# Patient Record
Sex: Male | Born: 1970 | Race: Black or African American | Hispanic: No | Marital: Single | Smoking: Current every day smoker
Health system: Southern US, Community
[De-identification: ages and names within clinical notes are randomized; demographics above are authoritative.]

## PROBLEM LIST (undated history)

## (undated) DIAGNOSIS — I10 Essential (primary) hypertension: Secondary | ICD-10-CM

## (undated) DIAGNOSIS — E119 Type 2 diabetes mellitus without complications: Secondary | ICD-10-CM

---

## 2001-12-13 ENCOUNTER — Encounter: Payer: Self-pay | Admitting: Internal Medicine

## 2001-12-13 ENCOUNTER — Emergency Department (HOSPITAL_COMMUNITY): Admission: EM | Admit: 2001-12-13 | Discharge: 2001-12-13 | Payer: Self-pay | Admitting: Internal Medicine

## 2001-12-14 ENCOUNTER — Emergency Department (HOSPITAL_COMMUNITY): Admission: EM | Admit: 2001-12-14 | Discharge: 2001-12-14 | Payer: Self-pay | Admitting: Emergency Medicine

## 2001-12-15 ENCOUNTER — Emergency Department (HOSPITAL_COMMUNITY): Admission: EM | Admit: 2001-12-15 | Discharge: 2001-12-15 | Payer: Self-pay | Admitting: Emergency Medicine

## 2002-01-11 ENCOUNTER — Emergency Department (HOSPITAL_COMMUNITY): Admission: EM | Admit: 2002-01-11 | Discharge: 2002-01-12 | Payer: Self-pay | Admitting: Emergency Medicine

## 2002-01-12 ENCOUNTER — Encounter: Payer: Self-pay | Admitting: Emergency Medicine

## 2005-07-15 ENCOUNTER — Emergency Department (HOSPITAL_COMMUNITY): Admission: EM | Admit: 2005-07-15 | Discharge: 2005-07-15 | Payer: Self-pay | Admitting: Emergency Medicine

## 2007-10-18 ENCOUNTER — Emergency Department (HOSPITAL_COMMUNITY): Admission: EM | Admit: 2007-10-18 | Discharge: 2007-10-18 | Payer: Self-pay | Admitting: Emergency Medicine

## 2008-04-17 ENCOUNTER — Emergency Department (HOSPITAL_COMMUNITY): Admission: EM | Admit: 2008-04-17 | Discharge: 2008-04-17 | Payer: Self-pay | Admitting: Emergency Medicine

## 2008-08-10 ENCOUNTER — Emergency Department (HOSPITAL_COMMUNITY): Admission: EM | Admit: 2008-08-10 | Discharge: 2008-08-10 | Payer: Self-pay | Admitting: Emergency Medicine

## 2009-09-28 ENCOUNTER — Emergency Department (HOSPITAL_COMMUNITY): Admission: EM | Admit: 2009-09-28 | Discharge: 2009-09-29 | Payer: Self-pay | Admitting: Emergency Medicine

## 2010-02-27 ENCOUNTER — Emergency Department (HOSPITAL_COMMUNITY): Admission: EM | Admit: 2010-02-27 | Discharge: 2010-02-27 | Payer: Self-pay | Admitting: Emergency Medicine

## 2010-03-02 ENCOUNTER — Emergency Department (HOSPITAL_COMMUNITY): Admission: EM | Admit: 2010-03-02 | Discharge: 2010-03-02 | Payer: Self-pay | Admitting: Emergency Medicine

## 2010-12-26 LAB — URINE CULTURE
Colony Count: NO GROWTH
Culture: NO GROWTH

## 2010-12-26 LAB — URINALYSIS, ROUTINE W REFLEX MICROSCOPIC
Glucose, UA: NEGATIVE mg/dL
Ketones, ur: 15 mg/dL — AB
Leukocytes, UA: NEGATIVE
Nitrite: NEGATIVE
Specific Gravity, Urine: >1.03 — ABNORMAL HIGH (ref 1.005–1.030)
Urobilinogen, UA: 1 mg/dL (ref 0.0–1.0)
pH: 5.5 (ref 5.0–8.0)

## 2010-12-26 LAB — URINE MICROSCOPIC-ADD ON

## 2011-07-11 LAB — DIFFERENTIAL
Basophils Absolute: 0
Basophils Relative: 0
Eosinophils Absolute: 0.2
Eosinophils Relative: 2
Lymphocytes Relative: 29
Lymphs Abs: 2.3
Monocytes Absolute: 0.6
Monocytes Relative: 8
Neutro Abs: 4.8
Neutrophils Relative %: 60

## 2011-07-11 LAB — URINALYSIS, ROUTINE W REFLEX MICROSCOPIC
Bilirubin Urine: NEGATIVE
Glucose, UA: NEGATIVE
Hgb urine dipstick: NEGATIVE
Ketones, ur: NEGATIVE
Nitrite: NEGATIVE
Protein, ur: NEGATIVE
Specific Gravity, Urine: >1.03 — ABNORMAL HIGH
Urobilinogen, UA: 0.2
pH: 5

## 2011-07-11 LAB — BASIC METABOLIC PANEL
BUN: 10
CO2: 25
Calcium: 9.3
Chloride: 102
Creatinine, Ser: 1.07
GFR calc Af Amer: >60
GFR calc non Af Amer: >60
Glucose, Bld: 113 — ABNORMAL HIGH
Potassium: 4
Sodium: 136

## 2011-07-11 LAB — CBC
HCT: 46
Hemoglobin: 15.3
MCHC: 33.2
MCV: 84.7
Platelets: 163
RBC: 5.44
RDW: 13.4
WBC: 8

## 2013-09-17 ENCOUNTER — Emergency Department (HOSPITAL_COMMUNITY)
Admission: EM | Admit: 2013-09-17 | Discharge: 2013-09-17 | Disposition: A | Discharge status: Home / Self Care | Payer: PRIVATE HEALTH INSURANCE | Attending: Emergency Medicine | Admitting: Emergency Medicine

## 2013-09-17 ENCOUNTER — Emergency Department (HOSPITAL_COMMUNITY): Payer: PRIVATE HEALTH INSURANCE

## 2013-09-17 ENCOUNTER — Encounter (HOSPITAL_COMMUNITY): Payer: Self-pay | Admitting: Emergency Medicine

## 2013-09-17 DIAGNOSIS — Y929 Unspecified place or not applicable: Secondary | ICD-10-CM | POA: Insufficient documentation

## 2013-09-17 DIAGNOSIS — S8990XA Unspecified injury of unspecified lower leg, initial encounter: Secondary | ICD-10-CM | POA: Insufficient documentation

## 2013-09-17 DIAGNOSIS — M79672 Pain in left foot: Secondary | ICD-10-CM

## 2013-09-17 DIAGNOSIS — I10 Essential (primary) hypertension: Secondary | ICD-10-CM | POA: Insufficient documentation

## 2013-09-17 DIAGNOSIS — Y939 Activity, unspecified: Secondary | ICD-10-CM | POA: Insufficient documentation

## 2013-09-17 DIAGNOSIS — F17200 Nicotine dependence, unspecified, uncomplicated: Secondary | ICD-10-CM | POA: Insufficient documentation

## 2013-09-17 DIAGNOSIS — X500XXA Overexertion from strenuous movement or load, initial encounter: Secondary | ICD-10-CM | POA: Insufficient documentation

## 2013-09-17 HISTORY — DX: Essential (primary) hypertension: I10

## 2013-09-17 MED ORDER — IBUPROFEN 400 MG PO TABS
600.0000 mg | ORAL_TABLET | Freq: Once | ORAL | Status: DC
  Filled 2013-09-17: qty 2

## 2013-09-17 MED ORDER — IBUPROFEN 600 MG PO TABS: 600.0000 mg | ORAL_TABLET | Freq: Three times a day (TID) | ORAL | Status: DC | PRN

## 2013-09-17 MED ORDER — OXYCODONE-ACETAMINOPHEN 5-325 MG PO TABS
1.0000 | ORAL_TABLET | Freq: Once | ORAL | Status: AC
  Administered 2013-09-17: 1 via ORAL
  Filled 2013-09-17: qty 1

## 2013-09-17 MED ORDER — HYDROCODONE-ACETAMINOPHEN 5-325 MG PO TABS: 1.0000 | ORAL_TABLET | ORAL | Status: DC | PRN

## 2013-09-17 MED ORDER — IBUPROFEN 800 MG PO TABS
800.0000 mg | ORAL_TABLET | Freq: Once | ORAL | Status: AC
  Administered 2013-09-17: 800 mg via ORAL

## 2013-09-17 NOTE — ED Notes (Signed)
Pt back from x-ray.

## 2013-09-17 NOTE — ED Provider Notes (Signed)
CSN: [id number]     Arrival date & time 09/17/13  0745 History  This chart was scribed for Lyanne Co, MD by Quintella Reichert, ED scribe.  This patient was seen in room APA04/[facility]04 and the patient's care was started at 8:16 AM.   Chief Complaint  Patient presents with  . Foot Pain    The history is provided by the patient. No language interpreter was used.    HPI Comments: Alec Howell is a 42 y.o. male who presents to the Emergency Department complaining of left ankle pain. Pt states that one month ago he rolled his ankle outwards and immediately developed moderate-to-severe pain to that ankle.  His pain initially improved but then began worsening again yesterday.  Presently he complains of severe throbbing pain across the top of his ankle on both sides.  Pain is worsened by bearing weight.  He is ambulatory with pain.  He has attempted to treat pain with Aleve, without relief.   Past Medical History  Diagnosis Date  . Hypertension     History reviewed. No pertinent past surgical history.  No family history on file.   History  Substance Use Topics  . Smoking status: Current Every Day Smoker  . Smokeless tobacco: Not on file  . Alcohol Use: No     Review of Systems A complete 10 system review of systems was obtained and all systems are negative except as noted in the HPI and PMH.    Allergies  Review of patient's allergies indicates no known allergies.  Home Medications   Current Outpatient Rx  Name  Route  Sig  Dispense  Refill  . naproxen sodium (ALEVE) 220 MG tablet   Oral   Take 220 mg by mouth 2 (two) times daily as needed (pain).         Marland Kitchen HYDROcodone-acetaminophen (NORCO/VICODIN) 5-325 MG per tablet   Oral   Take 1 tablet by mouth every 4 (four) hours as needed for moderate pain.   15 tablet   0   . ibuprofen (ADVIL,MOTRIN) 600 MG tablet   Oral   Take 1 tablet (600 mg total) by mouth every 8 (eight) hours as needed.   15 tablet   0     BP  190/112  Pulse 88  Temp(Src) 98.3 F (36.8 C) (Oral)  Resp 20  SpO2 100%  Physical Exam  Nursing note and vitals reviewed. Constitutional: He is oriented to person, place, and time. He appears well-developed and well-nourished.  HENT:  Head: Normocephalic.  Eyes: EOM are normal.  Neck: Normal range of motion.  Pulmonary/Chest: Effort normal.  Abdominal: He exhibits no distension.  Musculoskeletal:  Normal DP and PT pulses. Tender over anterior mid-foot.  No tenderness over medial malleolus.  Mild tenderness at the left lateral malleolus.  Mild swelling of the dorsum of the left foot.  No erythema.  Neurological: He is alert and oriented to person, place, and time.  Psychiatric: He has a normal mood and affect.    ED Course  Procedures (including critical care time)  DIAGNOSTIC STUDIES: Oxygen Saturation is 100% on room air, normal by my interpretation.    COORDINATION OF CARE: 9:11 AM-Discussed treatment plan which includes pain medication and x-ray with pt at bedside and pt agreed to plan.    Labs Review Labs Reviewed - No data to display   Imaging Review Dg Ankle Complete Left  09/17/2013   CLINICAL DATA:  Ankle pain since a twisting injury 1 month  ago.  EXAM: LEFT ANKLE COMPLETE - 3+ VIEW  COMPARISON:  None.  FINDINGS: There is no fracture or dislocation. Tiny posterior spur on the distal tibia. No joint effusion.  IMPRESSION: Minimal arthritic change.   Electronically Signed   By: Geanie Cooley M.D.   On: 09/17/2013 08:47   Dg Foot Complete Left  09/17/2013   CLINICAL DATA:  Left foot and ankle pain since a twisting injury 1 month ago.  EXAM: LEFT FOOT - COMPLETE 3+ VIEW  COMPARISON:  None.  FINDINGS: There is slight bunion formation on the head of the 1st metatarsal. There is slight dorsal soft tissue swelling over the tarsal bones. Slight calcification at the Achilles insertion on the posterior calcaneus.  No fracture or dislocation or significant arthritis.   IMPRESSION: Dorsal soft tissue swelling. Bunion on the head of the 1st metatarsal.   Electronically Signed   By: Geanie Cooley M.D.   On: 09/17/2013 08:45  I personally reviewed the imaging tests through PACS system I reviewed available ER/hospitalization records through the EMR   EKG Interpretation   None       MDM   1. Left foot pain    Plain films normal. Normal pulses. No signs of infection. Ortho follow up. Ice and elevation.     I personally performed the services described in this documentation, which was scribed in my presence. The recorded information has been reviewed and is accurate.      Lyanne Co, MD 09/17/13 [phone]

## 2013-09-17 NOTE — ED Notes (Signed)
Pt reports twisted left ankle approx 1 month ago.  Reports ankle is not tender but top of foot is very tender to touch.  Reports is very painful with weight bearing.

## 2014-05-13 ENCOUNTER — Emergency Department (HOSPITAL_COMMUNITY): Payer: PRIVATE HEALTH INSURANCE

## 2014-05-13 ENCOUNTER — Encounter (HOSPITAL_COMMUNITY): Payer: Self-pay | Admitting: Emergency Medicine

## 2014-05-13 ENCOUNTER — Emergency Department (HOSPITAL_COMMUNITY)
Admission: EM | Admit: 2014-05-13 | Discharge: 2014-05-13 | Disposition: A | Discharge status: Home / Self Care | Payer: PRIVATE HEALTH INSURANCE | Attending: Emergency Medicine | Admitting: Emergency Medicine

## 2014-05-13 DIAGNOSIS — IMO0002 Reserved for concepts with insufficient information to code with codable children: Secondary | ICD-10-CM | POA: Insufficient documentation

## 2014-05-13 DIAGNOSIS — Z791 Long term (current) use of non-steroidal anti-inflammatories (NSAID): Secondary | ICD-10-CM | POA: Insufficient documentation

## 2014-05-13 DIAGNOSIS — F17200 Nicotine dependence, unspecified, uncomplicated: Secondary | ICD-10-CM | POA: Insufficient documentation

## 2014-05-13 DIAGNOSIS — M25569 Pain in unspecified knee: Secondary | ICD-10-CM | POA: Insufficient documentation

## 2014-05-13 DIAGNOSIS — Y939 Activity, unspecified: Secondary | ICD-10-CM | POA: Insufficient documentation

## 2014-05-13 DIAGNOSIS — X58XXXA Exposure to other specified factors, initial encounter: Secondary | ICD-10-CM | POA: Insufficient documentation

## 2014-05-13 DIAGNOSIS — Z87828 Personal history of other (healed) physical injury and trauma: Secondary | ICD-10-CM | POA: Insufficient documentation

## 2014-05-13 DIAGNOSIS — Y929 Unspecified place or not applicable: Secondary | ICD-10-CM | POA: Insufficient documentation

## 2014-05-13 DIAGNOSIS — I10 Essential (primary) hypertension: Secondary | ICD-10-CM | POA: Insufficient documentation

## 2014-05-13 DIAGNOSIS — S8391XA Sprain of unspecified site of right knee, initial encounter: Secondary | ICD-10-CM

## 2014-05-13 MED ORDER — NAPROXEN 500 MG PO TABS: 500.0000 mg | ORAL_TABLET | Freq: Two times a day (BID) | ORAL | Status: DC

## 2014-05-13 NOTE — ED Notes (Signed)
Pt c/o right knee pain and swelling since Monday. Pt denies injury and states this is an intermittent chronic problem. Pt reports tenderness and site is warm to touch.

## 2014-05-13 NOTE — ED Notes (Signed)
Right knee pain, onset Monday, swelling, hard to bend knee, denies injury. Pt comes in on crutches.

## 2014-05-13 NOTE — ED Provider Notes (Signed)
CSN: [id number]     Arrival date & time 05/13/14  4098 History  This chart was scribed for Alec Lennert, MD by Phillis Haggis, ED Scribe. This patient was seen in room APA06/APA06 and patient care was started at 7:31 AM.    Chief Complaint  Patient presents with  . Knee Pain   Patient is a 43 y.o. male presenting with knee pain. The history is provided by the patient. No language interpreter was used.  Knee Pain Location:  Knee Time since incident:  2 days Knee location:  R knee Pain details:    Radiates to:  Does not radiate   Severity:  Moderate   Duration:  2 days   Timing:  Constant Chronicity:  New Ineffective treatments:  Acetaminophen Associated symptoms: swelling   Associated symptoms: no back pain and no fatigue    HPI Comments: Alec Howell is a 43 y.o. male who presents to the Emergency Department complaining of right knee pain onset 2 days ago. He states that the pain has gotten worse over the past days. He states that he has been taking ibuprofen for the pain to some relief. He denies any recent injuries but states that he had an old injury to the area playing football.    Past Medical History  Diagnosis Date  . Hypertension    History reviewed. No pertinent past surgical history. History reviewed. No pertinent family history. History  Substance Use Topics  . Smoking status: Current Every Day Smoker -- 1.00 packs/day    Types: Cigarettes  . Smokeless tobacco: Not on file  . Alcohol Use: Yes    Review of Systems  Constitutional: Negative for appetite change and fatigue.  HENT: Negative for congestion, ear discharge and sinus pressure.   Eyes: Negative for discharge.  Respiratory: Negative for cough.   Cardiovascular: Negative for chest pain.  Gastrointestinal: Negative for abdominal pain and diarrhea.  Genitourinary: Negative for frequency and hematuria.  Musculoskeletal: Negative for back pain.  Skin: Negative for rash.  Neurological: Negative for  seizures and headaches.  Psychiatric/Behavioral: Negative for hallucinations.      Allergies  Review of patient's allergies indicates no known allergies.  Home Medications   Prior to Admission medications   Medication Sig Start Date End Date Taking? Authorizing Provider  HYDROcodone-acetaminophen (NORCO/VICODIN) 5-325 MG per tablet Take 1 tablet by mouth every 4 (four) hours as needed for moderate pain. 09/17/13   Lyanne Co, MD  ibuprofen (ADVIL,MOTRIN) 600 MG tablet Take 1 tablet (600 mg total) by mouth every 8 (eight) hours as needed. 09/17/13   Lyanne Co, MD  naproxen sodium (ALEVE) 220 MG tablet Take 220 mg by mouth 2 (two) times daily as needed (pain).    Historical Provider, MD   BP 163/106  Pulse 90  Temp(Src) 98.6 F (37 C) (Oral)  Resp 18  Ht 5\' 3"  (1.6 m)  Wt 225 lb (102.059 kg)  BMI 39.87 kg/m2  SpO2 95% Physical Exam  Constitutional: He is oriented to person, place, and time. He appears well-developed.  HENT:  Head: Normocephalic.  Eyes: Conjunctivae are normal.  Neck: No tracheal deviation present.  Cardiovascular:  No murmur heard. Musculoskeletal: Normal range of motion.  Mild swelling to right knee with tenderness medially.   Neurological: He is oriented to person, place, and time.  Skin: Skin is warm.  Psychiatric: He has a normal mood and affect.    ED Course  Procedures (including critical care time) DIAGNOSTIC STUDIES: Oxygen  Saturation is 95% on room air, normal by my interpretation.    COORDINATION OF CARE: 7:31 AM-Discussed treatment plan which includes x-ray with pt at bedside and pt agreed to plan.   Labs Review Labs Reviewed - No data to display  Imaging Review Dg Knee Complete 4 Views Right  05/13/2014   CLINICAL DATA:  Pain and swelling  EXAM: RIGHT KNEE - COMPLETE 4+ VIEW  COMPARISON:  None.  FINDINGS: Frontal, lateral, and bilateral oblique views were obtained. There is a joint effusion. No fracture or dislocation. Joint  spaces appear intact. There is a spur arising from the anterior superior patella. No apparent erosive change.  IMPRESSION: Joint effusion. No fracture. Distal quadriceps tendinosis. No appreciable joint space narrowing.   Electronically Signed   By: Bretta BangWilliam  Woodruff M.D.   On: 05/13/2014 08:05     EKG Interpretation None      MDM   Final diagnoses:  None   The chart was scribed for me under my direct supervision.  I personally performed the history, physical, and medical decision making and all procedures in the evaluation of this patient.Alec Howell.    Alec Sublette L Avram Danielson, MD 05/13/14 [phone]

## 2014-07-28 ENCOUNTER — Encounter (HOSPITAL_COMMUNITY): Payer: Self-pay | Admitting: Emergency Medicine

## 2014-07-28 ENCOUNTER — Emergency Department (HOSPITAL_COMMUNITY)
Admission: EM | Admit: 2014-07-28 | Discharge: 2014-07-28 | Disposition: A | Discharge status: Home / Self Care | Payer: PRIVATE HEALTH INSURANCE | Attending: Emergency Medicine | Admitting: Emergency Medicine

## 2014-07-28 DIAGNOSIS — Z791 Long term (current) use of non-steroidal anti-inflammatories (NSAID): Secondary | ICD-10-CM | POA: Diagnosis not present

## 2014-07-28 DIAGNOSIS — M25461 Effusion, right knee: Secondary | ICD-10-CM

## 2014-07-28 DIAGNOSIS — I10 Essential (primary) hypertension: Secondary | ICD-10-CM | POA: Insufficient documentation

## 2014-07-28 DIAGNOSIS — Z720 Tobacco use: Secondary | ICD-10-CM | POA: Insufficient documentation

## 2014-07-28 DIAGNOSIS — M25561 Pain in right knee: Secondary | ICD-10-CM

## 2014-07-28 MED ORDER — NAPROXEN 500 MG PO TABS: 500.0000 mg | ORAL_TABLET | Freq: Two times a day (BID) | ORAL | Status: DC

## 2014-07-28 MED ORDER — OXYCODONE-ACETAMINOPHEN 5-325 MG PO TABS: 1.0000 | ORAL_TABLET | ORAL | Status: DC | PRN

## 2014-07-28 NOTE — ED Notes (Signed)
Pt reports right knee has been swelling since Thursday. Pt denies any new activity or injury. Pt states he does stand a lot at work.

## 2014-07-28 NOTE — ED Notes (Signed)
Pt alert & oriented x4, stable gait. Patient given discharge instructions, paperwork & prescription(s). Patient verbalized understanding. Pt left department by wheelchair w/ no further questions. 

## 2014-07-28 NOTE — Discharge Instructions (Signed)
Wear your knee immobilizer and use your crutches as needed.  Knee Pain The knee is the complex joint between your thigh and your lower leg. It is made up of bones, tendons, ligaments, and cartilage. The bones that make up the knee are:  The femur in the thigh.  The tibia and fibula in the lower leg.  The patella or kneecap riding in the groove on the lower femur. CAUSES  Knee pain is a common complaint with many causes. A few of these causes are:  Injury, such as:  A ruptured ligament or tendon injury.  Torn cartilage.  Medical conditions, such as:  Gout  Arthritis  Infections  Overuse, over training, or overdoing a physical activity. Knee pain can be minor or severe. Knee pain can accompany debilitating injury. Minor knee problems often respond well to self-care measures or get well on their own. More serious injuries may need medical intervention or even surgery. SYMPTOMS The knee is complex. Symptoms of knee problems can vary widely. Some of the problems are:  Pain with movement and weight bearing.  Swelling and tenderness.  Buckling of the knee.  Inability to straighten or extend your knee.  Your knee locks and you cannot straighten it.  Warmth and redness with pain and fever.  Deformity or dislocation of the kneecap. DIAGNOSIS  Determining what is wrong may be very straight forward such as when there is an injury. It can also be challenging because of the complexity of the knee. Tests to make a diagnosis may include:  Your caregiver taking a history and doing a physical exam.  Routine X-rays can be used to rule out other problems. X-rays will not reveal a cartilage tear. Some injuries of the knee can be diagnosed by:  Arthroscopy a surgical technique by which a small video camera is inserted through tiny incisions on the sides of the knee. This procedure is used to examine and repair internal knee joint problems. Tiny instruments can be used during arthroscopy  to repair the torn knee cartilage (meniscus).  Arthrography is a radiology technique. A contrast liquid is directly injected into the knee joint. Internal structures of the knee joint then become visible on X-ray film.  An MRI scan is a non X-ray radiology procedure in which magnetic fields and a computer produce two- or three-dimensional images of the inside of the knee. Cartilage tears are often visible using an MRI scanner. MRI scans have largely replaced arthrography in diagnosing cartilage tears of the knee.  Blood work.  Examination of the fluid that helps to lubricate the knee joint (synovial fluid). This is done by taking a sample out using a needle and a syringe. TREATMENT The treatment of knee problems depends on the cause. Some of these treatments are:  Depending on the injury, proper casting, splinting, surgery, or physical therapy care will be needed.  Give yourself adequate recovery time. Do not overuse your joints. If you begin to get sore during workout routines, back off. Slow down or do fewer repetitions.  For repetitive activities such as cycling or running, maintain your strength and nutrition.  Alternate muscle groups. For example, if you are a weight lifter, work the upper body on one day and the lower body the next.  Either tight or weak muscles do not give the proper support for your knee. Tight or weak muscles do not absorb the stress placed on the knee joint. Keep the muscles surrounding the knee strong.  Take care of mechanical problems.  If you have flat feet, orthotics or special shoes may help. See your caregiver if you need help.  Arch supports, sometimes with wedges on the inner or outer aspect of the heel, can help. These can shift pressure away from the side of the knee most bothered by osteoarthritis.  A brace called an "unloader" brace also may be used to help ease the pressure on the most arthritic side of the knee.  If your caregiver has prescribed  crutches, braces, wraps or ice, use as directed. The acronym for this is PRICE. This means protection, rest, ice, compression, and elevation.  Nonsteroidal anti-inflammatory drugs (NSAIDs), can help relieve pain. But if taken immediately after an injury, they may actually increase swelling. Take NSAIDs with food in your stomach. Stop them if you develop stomach problems. Do not take these if you have a history of ulcers, stomach pain, or bleeding from the bowel. Do not take without your caregiver's approval if you have problems with fluid retention, heart failure, or kidney problems.  For ongoing knee problems, physical therapy may be helpful.  Glucosamine and chondroitin are over-the-counter dietary supplements. Both may help relieve the pain of osteoarthritis in the knee. These medicines are different from the usual anti-inflammatory drugs. Glucosamine may decrease the rate of cartilage destruction.  Injections of a corticosteroid drug into your knee joint may help reduce the symptoms of an arthritis flare-up. They may provide pain relief that lasts a few months. You may have to wait a few months between injections. The injections do have a small increased risk of infection, water retention, and elevated blood sugar levels.  Hyaluronic acid injected into damaged joints may ease pain and provide lubrication. These injections may work by reducing inflammation. A series of shots may give relief for as long as 6 months.  Topical painkillers. Applying certain ointments to your skin may help relieve the pain and stiffness of osteoarthritis. Ask your pharmacist for suggestions. Many over the-counter products are approved for temporary relief of arthritis pain.  In some countries, doctors often prescribe topical NSAIDs for relief of chronic conditions such as arthritis and tendinitis. A review of treatment with NSAID creams found that they worked as well as oral medications but without the serious side  effects. PREVENTION  Maintain a healthy weight. Extra pounds put more strain on your joints.  Get strong, stay limber. Weak muscles are a common cause of knee injuries. Stretching is important. Include flexibility exercises in your workouts.  Be smart about exercise. If you have osteoarthritis, chronic knee pain or recurring injuries, you may need to change the way you exercise. This does not mean you have to stop being active. If your knees ache after jogging or playing basketball, consider switching to swimming, water aerobics, or other low-impact activities, at least for a few days a week. Sometimes limiting high-impact activities will provide relief.  Make sure your shoes fit well. Choose footwear that is right for your sport.  Protect your knees. Use the proper gear for knee-sensitive activities. Use kneepads when playing volleyball or laying carpet. Buckle your seat belt every time you drive. Most shattered kneecaps occur in car accidents.  Rest when you are tired. SEEK MEDICAL CARE IF:  You have knee pain that is continual and does not seem to be getting better.  SEEK IMMEDIATE MEDICAL CARE IF:  Your knee joint feels hot to the touch and you have a high fever. MAKE SURE YOU:   Understand these instructions.  Will watch your  condition.  Will get help right away if you are not doing well or get worse. Document Released: 07/23/2007 Document Revised: 12/18/2011 Document Reviewed: 07/23/2007 [facility] Patient Information 2015 [place], [place]. This information is not intended to replace advice given to you by your health care provider. Make sure you discuss any questions you have with your health care provider.  Naproxen and naproxen sodium oral immediate-release tablets What is this medicine? NAPROXEN (na PROX en) is a non-steroidal anti-inflammatory drug (NSAID). It is used to reduce swelling and to treat pain. This medicine may be used for dental pain, headache, or painful monthly  periods. It is also used for painful joint and muscular problems such as arthritis, tendinitis, bursitis, and gout. This medicine may be used for other purposes; ask your health care provider or pharmacist if you have questions. COMMON BRAND NAME(S): Aflaxen, Aleve, Aleve Arthritis, All Day Relief, Anaprox, Anaprox DS, Naprosyn What should I tell my health care provider before I take this medicine? They need to know if you have any of these conditions: -asthma -cigarette smoker -drink more than 3 alcohol containing drinks a day -heart disease or circulation problems such as heart failure or leg edema (fluid retention) -high blood pressure -kidney disease -liver disease -stomach bleeding or ulcers -an unusual or allergic reaction to naproxen, aspirin, other NSAIDs, other medicines, foods, dyes, or preservatives -pregnant or trying to get pregnant -breast-feeding How should I use this medicine? Take this medicine by mouth with a glass of water. Follow the directions on the prescription label. Take it with food if your stomach gets upset. Try to not lie down for at least 10 minutes after you take it. Take your medicine at regular intervals. Do not take your medicine more often than directed. Long-term, continuous use may increase the risk of heart attack or stroke. A special MedGuide will be given to you by the pharmacist with each prescription and refill. Be sure to read this information carefully each time. Talk to your pediatrician regarding the use of this medicine in children. Special care may be needed. Overdosage: If you think you have taken too much of this medicine contact a poison control center or emergency room at once. NOTE: This medicine is only for you. Do not share this medicine with others. What if I miss a dose? If you miss a dose, take it as soon as you can. If it is almost time for your next dose, take only that dose. Do not take double or extra doses. What may interact with  this medicine? -alcohol -aspirin -cidofovir -diuretics -lithium -methotrexate -other drugs for inflammation like ketorolac or prednisone -pemetrexed -probenecid -warfarin This list may not describe all possible interactions. Give your health care provider a list of all the medicines, herbs, non-prescription drugs, or dietary supplements you use. Also tell them if you smoke, drink alcohol, or use illegal drugs. Some items may interact with your medicine. What should I watch for while using this medicine? Tell your doctor or health care professional if your pain does not get better. Talk to your doctor before taking another medicine for pain. Do not treat yourself. This medicine does not prevent heart attack or stroke. In fact, this medicine may increase the chance of a heart attack or stroke. The chance may increase with longer use of this medicine and in people who have heart disease. If you take aspirin to prevent heart attack or stroke, talk with your doctor or health care professional. Do not take other medicines that  contain aspirin, ibuprofen, or naproxen with this medicine. Side effects such as stomach upset, nausea, or ulcers may be more likely to occur. Many medicines available without a prescription should not be taken with this medicine. This medicine can cause ulcers and bleeding in the stomach and intestines at any time during treatment. Do not smoke cigarettes or drink alcohol. These increase irritation to your stomach and can make it more susceptible to damage from this medicine. Ulcers and bleeding can happen without warning symptoms and can cause death. You may get drowsy or dizzy. Do not drive, use machinery, or do anything that needs mental alertness until you know how this medicine affects you. Do not stand or sit up quickly, especially if you are an older patient. This reduces the risk of dizzy or fainting spells. This medicine can cause you to bleed more easily. Try to avoid  damage to your teeth and gums when you brush or floss your teeth. What side effects may I notice from receiving this medicine? Side effects that you should report to your doctor or health care professional as soon as possible: -black or bloody stools, blood in the urine or vomit -blurred vision -chest pain -difficulty breathing or wheezing -nausea or vomiting -severe stomach pain -skin rash, skin redness, blistering or peeling skin, hives, or itching -slurred speech or weakness on one side of the body -swelling of eyelids, throat, lips -unexplained weight gain or swelling -unusually weak or tired -yellowing of eyes or skin Side effects that usually do not require medical attention (report to your doctor or health care professional if they continue or are bothersome): -constipation -headache -heartburn This list may not describe all possible side effects. Call your doctor for medical advice about side effects. You may report side effects to FDA at 1-800-FDA-1088. Where should I keep my medicine? Keep out of the reach of children. Store at room temperature between 15 and 30 degrees C (59 and 86 degrees F). Keep container tightly closed. Throw away any unused medicine after the expiration date. NOTE: This sheet is a summary. It may not cover all possible information. If you have questions about this medicine, talk to your doctor, pharmacist, or health care provider.  2015, Elsevier/Gold Standard. (2009-09-27 20:10:16)  Acetaminophen; Oxycodone tablets What is this medicine? ACETAMINOPHEN; OXYCODONE (a set a MEE noe fen; ox i KOE done) is a pain reliever. It is used to treat mild to moderate pain. This medicine may be used for other purposes; ask your health care provider or pharmacist if you have questions. COMMON BRAND NAME(S): Endocet, Magnacet, Narvox, Percocet, Perloxx, Primalev, Primlev, Roxicet, Xolox What should I tell my health care provider before I take this medicine? They  need to know if you have any of these conditions: -brain tumor -Crohn's disease, inflammatory bowel disease, or ulcerative colitis -drug abuse or addiction -head injury -heart or circulation problems -if you often drink alcohol -kidney disease or problems going to the bathroom -liver disease -lung disease, asthma, or breathing problems -an unusual or allergic reaction to acetaminophen, oxycodone, other opioid analgesics, other medicines, foods, dyes, or preservatives -pregnant or trying to get pregnant -breast-feeding How should I use this medicine? Take this medicine by mouth with a full glass of water. Follow the directions on the prescription label. Take your medicine at regular intervals. Do not take your medicine more often than directed. Talk to your pediatrician regarding the use of this medicine in children. Special care may be needed. Patients over 20 years old 73  have a stronger reaction and need a smaller dose. Overdosage: If you think you have taken too much of this medicine contact a poison control center or emergency room at once. NOTE: This medicine is only for you. Do not share this medicine with others. What if I miss a dose? If you miss a dose, take it as soon as you can. If it is almost time for your next dose, take only that dose. Do not take double or extra doses. What may interact with this medicine? -alcohol -antihistamines -barbiturates like amobarbital, butalbital, butabarbital, methohexital, pentobarbital, phenobarbital, thiopental, and secobarbital -benztropine -drugs for bladder problems like solifenacin, trospium, oxybutynin, tolterodine, hyoscyamine, and methscopolamine -drugs for breathing problems like ipratropium and tiotropium -drugs for certain stomach or intestine problems like propantheline, homatropine methylbromide, glycopyrrolate, atropine, belladonna, and dicyclomine -general anesthetics like etomidate, ketamine, nitrous oxide, propofol,  desflurane, enflurane, halothane, isoflurane, and sevoflurane -medicines for depression, anxiety, or psychotic disturbances -medicines for sleep -muscle relaxants -naltrexone -narcotic medicines (opiates) for pain -phenothiazines like perphenazine, thioridazine, chlorpromazine, mesoridazine, fluphenazine, prochlorperazine, promazine, and trifluoperazine -scopolamine -tramadol -trihexyphenidyl This list may not describe all possible interactions. Give your health care provider a list of all the medicines, herbs, non-prescription drugs, or dietary supplements you use. Also tell them if you smoke, drink alcohol, or use illegal drugs. Some items may interact with your medicine. What should I watch for while using this medicine? Tell your doctor or health care professional if your pain does not go away, if it gets worse, or if you have new or a different type of pain. You may develop tolerance to the medicine. Tolerance means that you will need a higher dose of the medication for pain relief. Tolerance is normal and is expected if you take this medicine for a long time. Do not suddenly stop taking your medicine because you may develop a severe reaction. Your body becomes used to the medicine. This does NOT mean you are addicted. Addiction is a behavior related to getting and using a drug for a non-medical reason. If you have pain, you have a medical reason to take pain medicine. Your doctor will tell you how much medicine to take. If your doctor wants you to stop the medicine, the dose will be slowly lowered over time to avoid any side effects. You may get drowsy or dizzy. Do not drive, use machinery, or do anything that needs mental alertness until you know how this medicine affects you. Do not stand or sit up quickly, especially if you are an older patient. This reduces the risk of dizzy or fainting spells. Alcohol may interfere with the effect of this medicine. Avoid alcoholic drinks. There are  different types of narcotic medicines (opiates) for pain. If you take more than one type at the same time, you may have more side effects. Give your health care provider a list of all medicines you use. Your doctor will tell you how much medicine to take. Do not take more medicine than directed. Call emergency for help if you have problems breathing. The medicine will cause constipation. Try to have a bowel movement at least every 2 to 3 days. If you do not have a bowel movement for 3 days, call your doctor or health care professional. Do not take Tylenol (acetaminophen) or medicines that have acetaminophen with this medicine. Too much acetaminophen can be very dangerous. Many nonprescription medicines contain acetaminophen. Always read the labels carefully to avoid taking more acetaminophen. What side effects may I notice from  receiving this medicine? Side effects that you should report to your doctor or health care professional as soon as possible: -allergic reactions like skin rash, itching or hives, swelling of the face, lips, or tongue -breathing difficulties, wheezing -confusion -light headedness or fainting spells -severe stomach pain -unusually weak or tired -yellowing of the skin or the whites of the eyes Side effects that usually do not require medical attention (report to your doctor or health care professional if they continue or are bothersome): -dizziness -drowsiness -nausea -vomiting This list may not describe all possible side effects. Call your doctor for medical advice about side effects. You may report side effects to FDA at 1-800-FDA-1088. Where should I keep my medicine? Keep out of the reach of children. This medicine can be abused. Keep your medicine in a safe place to protect it from theft. Do not share this medicine with anyone. Selling or giving away this medicine is dangerous and against the law. Store at room temperature between 20 and 25 degrees C (68 and 77 degrees  F). Keep container tightly closed. Protect from light. This medicine may cause accidental overdose and death if it is taken by other adults, children, or pets. Flush any unused medicine down the toilet to reduce the chance of harm. Do not use the medicine after the expiration date. NOTE: This sheet is a summary. It may not cover all possible information. If you have questions about this medicine, talk to your doctor, pharmacist, or health care provider.  2015, Elsevier/Gold Standard. (2013-05-19 13:17:35)

## 2014-07-28 NOTE — ED Provider Notes (Signed)
CSN: [id number]     Arrival date & time 07/28/14  0510 History   First MD Initiated Contact with Patient 07/28/14 [id number]     Chief Complaint  Patient presents with  . Knee Pain     (Consider location/radiation/quality/duration/timing/severity/associated sxs/prior Treatment) Patient is a 43 y.o. male presenting with knee pain. The history is provided by the patient.  Knee Pain He started having pain and swelling in his right knee 5 days ago. He denies any trauma or overuse but does state that he has to stand up all day long on his job. He was seen in emergency department several months ago with a similar problem but it resolved after a few days. History taking ibuprofen without relief. He rates pain at 7/10. Pain is worse with standing and walking. He states that he is supposed to go to work this morning but won't be able to because of his knee pain. Pain does sometimes radiate down toward his ankle. He denies numbness or tingling.  Past Medical History  Diagnosis Date  . Hypertension    No past surgical history on file. No family history on file. History  Substance Use Topics  . Smoking status: Current Every Day Smoker -- 1.00 packs/day    Types: Cigarettes  . Smokeless tobacco: Not on file  . Alcohol Use: Yes    Review of Systems  All other systems reviewed and are negative.     Allergies  Review of patient's allergies indicates no known allergies.  Home Medications   Prior to Admission medications   Medication Sig Start Date End Date Taking? Authorizing Provider  ibuprofen (ADVIL,MOTRIN) 200 MG tablet Take 400 mg by mouth daily as needed for moderate pain.    Historical Provider, MD  naproxen (NAPROSYN) 500 MG tablet Take 1 tablet (500 mg total) by mouth 2 (two) times daily. 05/13/14   Benny LennertJoseph L Zammit, MD   BP 197/118  Pulse 89  Temp(Src) 98.1 F (36.7 C) (Oral)  Resp 25  Ht 5\' 3"  (1.6 m)  Wt 225 lb (102.059 kg)  BMI 39.87 kg/m2  SpO2 100% Physical Exam  Nursing  note and vitals reviewed.  43 year old male, resting comfortably and in no acute distress. Vital signs are significant for hypertension and tachypnea. Oxygen saturation is 100%, which is normal. Head is normocephalic and atraumatic. PERRLA, EOMI. Oropharynx is clear. Neck is nontender and supple without adenopathy or JVD. Back is nontender and there is no CVA tenderness. Lungs are clear without rales, wheezes, or rhonchi. Chest is nontender. Heart has regular rate and rhythm without murmur. Abdomen is soft, flat, nontender without masses or hepatosplenomegaly and peristalsis is normoactive. Extremities: There is a moderate effusion in the right knee but no erythema or warmth. There is no tenderness to direct palpation. There is no instability. Lockman test is negative. Her pain is elicited with passive knee flexion. Distal neurovascular exam is intact with strong pulses, parascapular region, and normal sensation. Skin is warm and dry without rash. Neurologic: Mental status is normal, cranial nerves are intact, there are no motor or sensory deficits.  ED Course  Procedures (including critical care time)  MDM   Final diagnoses:  Pain in right knee  Knee effusion, right    Right knee pain with effusion. This may represent degenerative joint disease. Consider possible meniscus injury. Old records reviewed and he was seen about 2 months ago with similar complaints. X-ray at that time showed an effusion without significant degenerative changes. He has  a knee immobilizer and crutches at home he is advised to use them. He is referred to orthopedics for followup. Prescription is given for naproxen and oxycodone-acetaminophen.    Dione Boozeavid Malakai Schoenherr, MD 07/28/14 [id number]

## 2014-08-03 MED FILL — Oxycodone w/ Acetaminophen Tab 5-325 MG: ORAL | Qty: 6 | Status: AC

## 2014-08-06 ENCOUNTER — Ambulatory Visit: Payer: PRIVATE HEALTH INSURANCE | Admitting: Orthopedic Surgery

## 2018-07-12 ENCOUNTER — Encounter (HOSPITAL_COMMUNITY): Payer: Self-pay | Admitting: Emergency Medicine

## 2018-07-12 ENCOUNTER — Emergency Department (HOSPITAL_COMMUNITY)
Admission: EM | Admit: 2018-07-12 | Discharge: 2018-07-12 | Disposition: A | Discharge status: Home / Self Care | Payer: BLUE CROSS/BLUE SHIELD | Attending: Emergency Medicine | Admitting: Emergency Medicine

## 2018-07-12 ENCOUNTER — Emergency Department (HOSPITAL_COMMUNITY): Payer: BLUE CROSS/BLUE SHIELD

## 2018-07-12 ENCOUNTER — Other Ambulatory Visit: Payer: Self-pay

## 2018-07-12 DIAGNOSIS — M25561 Pain in right knee: Secondary | ICD-10-CM

## 2018-07-12 DIAGNOSIS — I10 Essential (primary) hypertension: Secondary | ICD-10-CM | POA: Diagnosis not present

## 2018-07-12 DIAGNOSIS — F17210 Nicotine dependence, cigarettes, uncomplicated: Secondary | ICD-10-CM | POA: Diagnosis not present

## 2018-07-12 DIAGNOSIS — M25461 Effusion, right knee: Secondary | ICD-10-CM | POA: Diagnosis not present

## 2018-07-12 LAB — SYNOVIAL CELL COUNT + DIFF, W/ CRYSTALS
Crystals, Fluid: NONE SEEN
EOSINOPHILS-SYNOVIAL: 0 % (ref 0–1)
Lymphocytes-Synovial Fld: 1 % (ref 0–20)
MONOCYTE-MACROPHAGE-SYNOVIAL FLUID: 2 % — AB (ref 50–90)
Neutrophil, Synovial: 97 % — ABNORMAL HIGH (ref 0–25)
Other Cells-SYN: 0
WBC, Synovial: 15840 /mm3 — ABNORMAL HIGH (ref 0–200)

## 2018-07-12 LAB — PROTEIN, PLEURAL OR PERITONEAL FLUID: Total protein, fluid: 4.2 g/dL

## 2018-07-12 LAB — GLUCOSE, PLEURAL OR PERITONEAL FLUID: Glucose, Fluid: 94 mg/dL

## 2018-07-12 LAB — GRAM STAIN

## 2018-07-12 MED ORDER — PREDNISONE 20 MG PO TABS: ORAL_TABLET | ORAL | 0 refills | Status: DC

## 2018-07-12 MED ORDER — PREDNISONE 50 MG PO TABS
60.0000 mg | ORAL_TABLET | Freq: Once | ORAL | Status: AC
  Administered 2018-07-12: 60 mg via ORAL
  Filled 2018-07-12: qty 1

## 2018-07-12 MED ORDER — OXYCODONE-ACETAMINOPHEN 5-325 MG PO TABS
1.0000 | ORAL_TABLET | Freq: Once | ORAL | Status: AC
  Administered 2018-07-12: 1 via ORAL
  Filled 2018-07-12: qty 1

## 2018-07-12 MED ORDER — LIDOCAINE-EPINEPHRINE (PF) 2 %-1:200000 IJ SOLN
20.0000 mL | Freq: Once | INTRAMUSCULAR | Status: AC
  Administered 2018-07-12: 20 mL
  Filled 2018-07-12 (×2): qty 20

## 2018-07-12 MED ORDER — OXYCODONE-ACETAMINOPHEN 5-325 MG PO TABS: 1.0000 | ORAL_TABLET | Freq: Three times a day (TID) | ORAL | 0 refills | Status: DC | PRN

## 2018-07-12 NOTE — ED Provider Notes (Signed)
Emergency Department Provider Note   I have reviewed the triage vital signs and the nursing notes.   HISTORY  Chief Complaint Knee Pain   HPI Alec Howell is a 47 y.o. male who presents the emergency department today with atraumatic right knee pain and swelling for the last couple weeks.  States is having 4 views of the swelling goes down on its own.  No recent trauma.  Recent infection of his tooth which was on antibiotics for.  Also recently had a viral infection.  Lower extremity swelling.  No redness, erythema.  No history of gout. No other associated or modifying symptoms.    Past Medical History:  Diagnosis Date  . Hypertension     There are no active problems to display for this patient.   History reviewed. No pertinent surgical history.  Current Outpatient Rx  . Order #: [id number] Class: Historical Med  . Order #: [id number] Class: Historical Med  . Order #: [id number] Class: Print  . Order #: [id number] Class: Print    Allergies Patient has no known allergies.  History reviewed. No pertinent family history.  Social History Social History   Tobacco Use  . Smoking status: Current Every Day Smoker    Packs/day: 1.00    Types: Cigarettes  . Smokeless tobacco: Never Used  Substance Use Topics  . Alcohol use: Yes  . Drug use: No    Review of Systems  All other systems negative except as documented in the HPI. All pertinent positives and negatives as reviewed in the HPI. ____________________________________________   PHYSICAL EXAM:  VITAL SIGNS: ED Triage Vitals  Enc Vitals Group     BP 07/12/18 1510 (!) 185/113     Pulse Rate 07/12/18 1510 96     Resp 07/12/18 1510 20     Temp 07/12/18 1510 98 F (36.7 C)     Temp Source 07/12/18 1510 Oral     SpO2 07/12/18 1510 94 %     Weight 07/12/18 1509 230 lb (104.3 kg)     Height 07/12/18 1509 5\' 3"  (1.6 m)    Constitutional: Alert and oriented. Well appearing and in no acute distress. Eyes:  Conjunctivae are normal. PERRL. EOMI. Head: Atraumatic. Nose: No congestion/rhinnorhea. Mouth/Throat: Mucous membranes are moist.  Oropharynx non-erythematous. Neck: No stridor.  No meningeal signs.   Cardiovascular: Normal rate, regular rhythm. Good peripheral circulation. Grossly normal heart sounds.   Respiratory: Normal respiratory effort.  No retractions. Lungs CTAB. Gastrointestinal: Soft and nontender. No distention.  Musculoskeletal: No lower extremity tenderness nor edema. No gross deformities of extremities. Large effusion of left knee. No erythema, warmth. Some ttp at lower aspects of patella. Neurologic:  Normal speech and language. No gross focal neurologic deficits are appreciated.  Skin:  Skin is warm, dry and intact. No rash noted.   ____________________________________________   LABS (all labs ordered are listed, but only abnormal results are displayed)  Labs Reviewed  SYNOVIAL CELL COUNT + DIFF, W/ CRYSTALS - Abnormal; Notable for the following components:      Result Value   Color, Synovial YELLOW (*)    Appearance-Synovial TURBID (*)    WBC, Synovial 15,840 (*)    Neutrophil, Synovial 97 (*)    Monocyte-Macrophage-Synovial Fluid 2 (*)    All other components within normal limits  GRAM STAIN  BODY FLUID CULTURE  GLUCOSE, PLEURAL OR PERITONEAL FLUID  PROTEIN, PLEURAL OR PERITONEAL FLUID   ____________________________________________   RADIOLOGY  Dg Knee Complete 4 Views Right  Result Date:  07/12/2018 CLINICAL DATA:  RIGHT knee pain and difficulty bearing weight for 5 days, no known injury EXAM: RIGHT KNEE - COMPLETE 4+ VIEW COMPARISON:  05/13/2014 FINDINGS: Osseous mineralization low normal. Joint spaces preserved. No acute fracture, dislocation, or bone destruction. Large RIGHT knee joint effusion. IMPRESSION: Large RIGHT knee joint effusion without acute bony findings. Electronically Signed   By: Ulyses Southward M.D.   On: 07/12/2018 15:53     ____________________________________________   PROCEDURES  Procedure(s) performed:   .Joint Aspiration/Arthrocentesis Date/Time: 07/12/2018 10:57 PM Performed by: Marily Memos, MD Authorized by: Marily Memos, MD   Consent:    Consent obtained:  Verbal   Consent given by:  Patient   Risks discussed:  Infection and bleeding   Alternatives discussed:  No treatment Location:    Location:  Knee   Knee:  R knee Anesthesia (see MAR for exact dosages):    Anesthesia method:  Local infiltration   Local anesthetic:  Lidocaine 1% WITH epi Procedure details:    Preparation: Patient was prepped and draped in usual sterile fashion     Needle gauge:  18 G   Ultrasound guidance: no     Approach:  Lateral   Aspirate amount:  10   Aspirate characteristics:  Cloudy and serous   Steroid injected: no     Specimen collected: yes   Post-procedure details:    Dressing:  Adhesive bandage   Patient tolerance of procedure:  Tolerated well, no immediate complications     ____________________________________________   INITIAL IMPRESSION / ASSESSMENT AND PLAN / ED COURSE  Likely OA. No e/o gout or septic. Symptomatic treatment with ortho follow up.     Pertinent labs & imaging results that were available during my care of the patient were reviewed by me and considered in my medical decision making (see chart for details).  ____________________________________________  FINAL CLINICAL IMPRESSION(S) / ED DIAGNOSES  Final diagnoses:  Acute pain of right knee  Effusion of right knee  Hypertension, unspecified type     MEDICATIONS GIVEN DURING THIS VISIT:  Medications  oxyCODONE-acetaminophen (PERCOCET/ROXICET) 5-325 MG per tablet 1 tablet (1 tablet Oral Given 07/12/18 1638)  lidocaine-EPINEPHrine (XYLOCAINE W/EPI) 2 %-1:200000 (PF) injection 20 mL (20 mLs Infiltration Given 07/12/18 1638)  predniSONE (DELTASONE) tablet 60 mg (60 mg Oral Given 07/12/18 1850)     NEW OUTPATIENT  MEDICATIONS STARTED DURING THIS VISIT:  Discharge Medication List as of 07/12/2018  6:38 PM    START taking these medications   Details  predniSONE (DELTASONE) 20 MG tablet 3 tabs po daily x 3 days, then 2 tabs x 3 days, then 1.5 tabs x 3 days, then 1 tab x 3 days, then 0.5 tabs x 3 days, Print        Note:  This note was prepared with assistance of Dragon voice recognition software. Occasional wrong-word or sound-a-like substitutions may have occurred due to the inherent limitations of voice recognition software.   Marily Memos, MD 07/12/18 [phone]

## 2018-07-12 NOTE — ED Triage Notes (Signed)
Pt c/o of pain and swelling in right knee for 2 weeks.

## 2018-07-16 LAB — BODY FLUID CULTURE: Culture: NO GROWTH

## 2019-02-25 DIAGNOSIS — Z6841 Body Mass Index (BMI) 40.0 and over, adult: Secondary | ICD-10-CM | POA: Diagnosis not present

## 2019-02-25 DIAGNOSIS — R7301 Impaired fasting glucose: Secondary | ICD-10-CM | POA: Diagnosis not present

## 2019-02-25 DIAGNOSIS — Z0000 Encounter for general adult medical examination without abnormal findings: Secondary | ICD-10-CM | POA: Diagnosis not present

## 2019-02-25 DIAGNOSIS — R7309 Other abnormal glucose: Secondary | ICD-10-CM | POA: Diagnosis not present

## 2019-02-25 DIAGNOSIS — Z0001 Encounter for general adult medical examination with abnormal findings: Secondary | ICD-10-CM | POA: Diagnosis not present

## 2019-02-25 DIAGNOSIS — I10 Essential (primary) hypertension: Secondary | ICD-10-CM | POA: Diagnosis not present

## 2019-02-25 DIAGNOSIS — Z1389 Encounter for screening for other disorder: Secondary | ICD-10-CM | POA: Diagnosis not present

## 2019-03-11 DIAGNOSIS — Z6841 Body Mass Index (BMI) 40.0 and over, adult: Secondary | ICD-10-CM | POA: Diagnosis not present

## 2019-03-11 DIAGNOSIS — Z1389 Encounter for screening for other disorder: Secondary | ICD-10-CM | POA: Diagnosis not present

## 2019-03-11 DIAGNOSIS — E119 Type 2 diabetes mellitus without complications: Secondary | ICD-10-CM | POA: Diagnosis not present

## 2019-03-11 DIAGNOSIS — I10 Essential (primary) hypertension: Secondary | ICD-10-CM | POA: Diagnosis not present

## 2019-04-15 DIAGNOSIS — I10 Essential (primary) hypertension: Secondary | ICD-10-CM | POA: Diagnosis not present

## 2019-04-15 DIAGNOSIS — Z6841 Body Mass Index (BMI) 40.0 and over, adult: Secondary | ICD-10-CM | POA: Diagnosis not present

## 2019-09-12 ENCOUNTER — Other Ambulatory Visit: Payer: Self-pay

## 2019-09-12 DIAGNOSIS — Z20822 Contact with and (suspected) exposure to covid-19: Secondary | ICD-10-CM

## 2019-09-16 ENCOUNTER — Telehealth: Payer: Self-pay | Admitting: *Deleted

## 2019-09-16 NOTE — Telephone Encounter (Signed)
Pt calling for covid results, active. Aware not resulted yet.

## 2019-09-17 ENCOUNTER — Telehealth: Payer: Self-pay

## 2019-09-17 LAB — NOVEL CORONAVIRUS, NAA: SARS-CoV-2, NAA: NOT DETECTED

## 2019-09-17 NOTE — Telephone Encounter (Signed)
Caller advise that result in no back yet

## 2019-09-17 NOTE — Telephone Encounter (Signed)
Pt. Given COVID 19 results, verbalizes understanding. 

## 2019-09-30 IMAGING — DX DG KNEE COMPLETE 4+V*R*
4 series · 4 of 4 positions shown · non-contrast
Comparison: 05/13/2014

CLINICAL DATA: RIGHT knee pain and difficulty bearing weight for 5
days, no known injury

EXAM:
RIGHT KNEE - COMPLETE 4+ VIEW

[knee ap (1 of 3)]
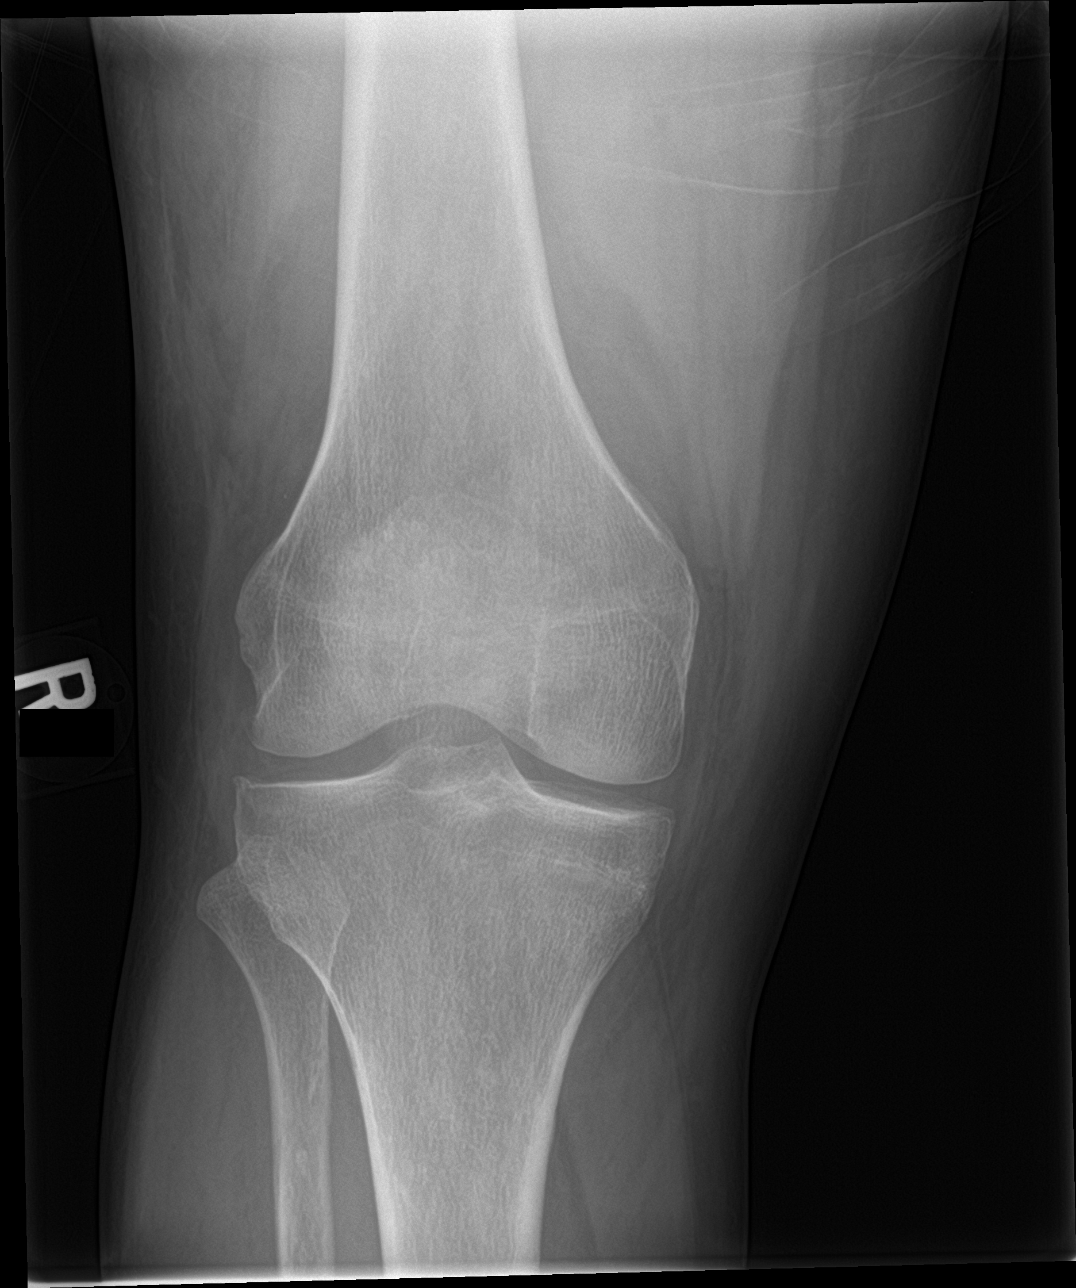

[knee ap (2 of 3)]
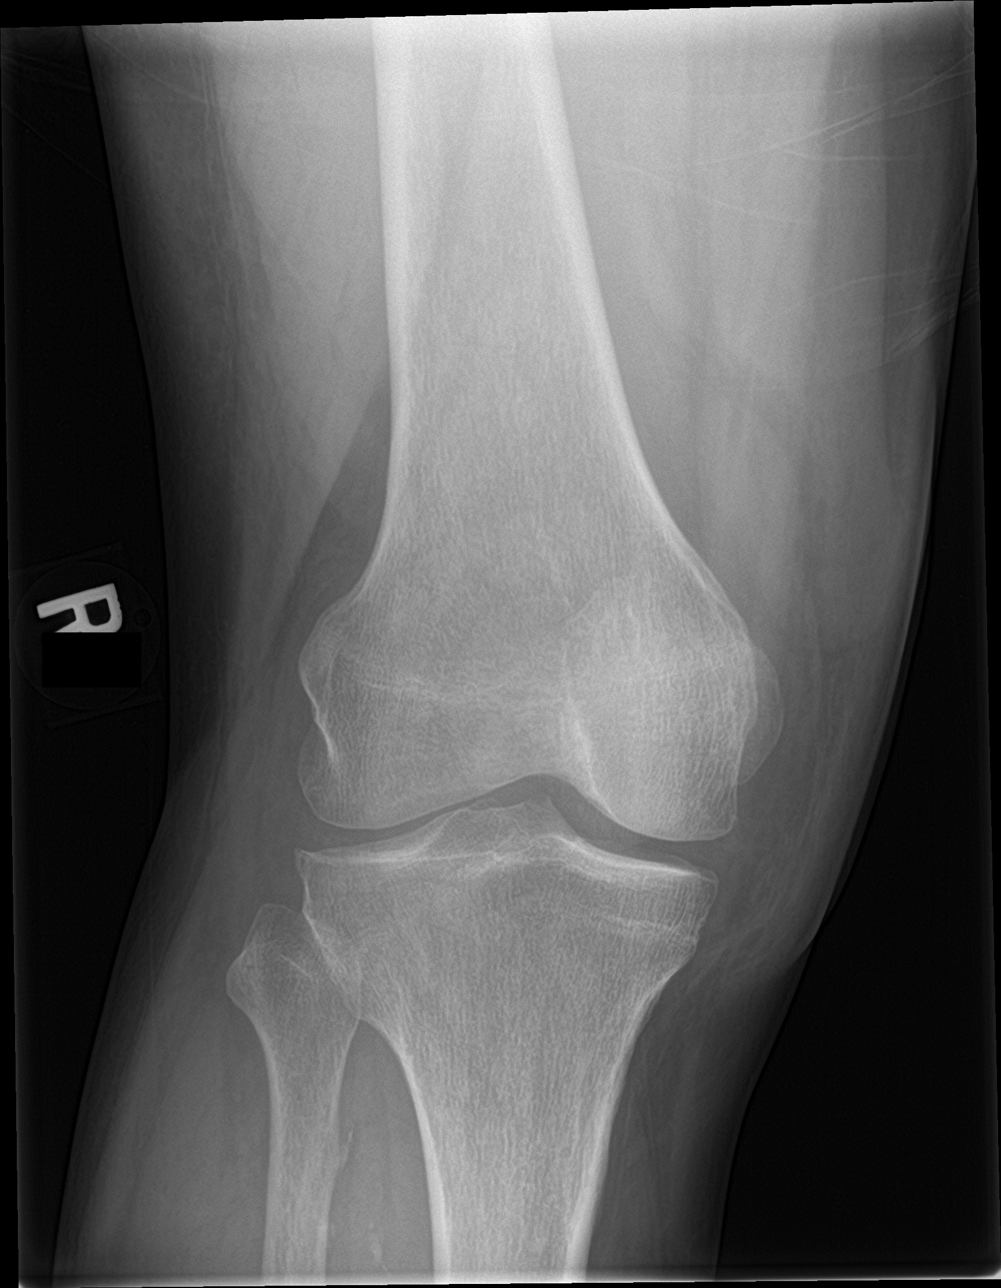

[knee ap (3 of 3)]
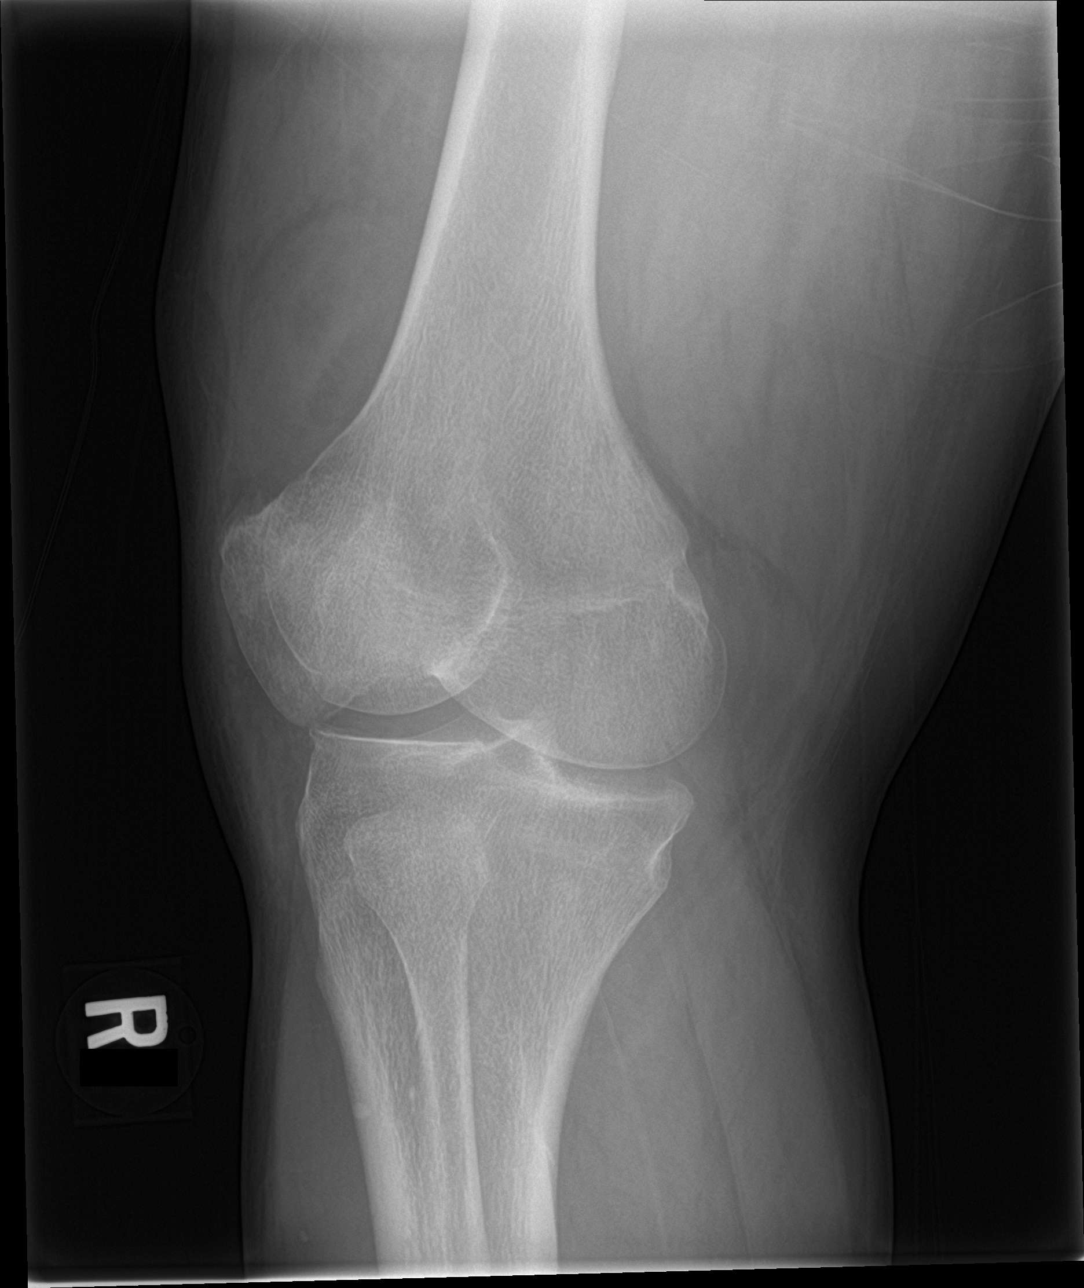

[knee lat]
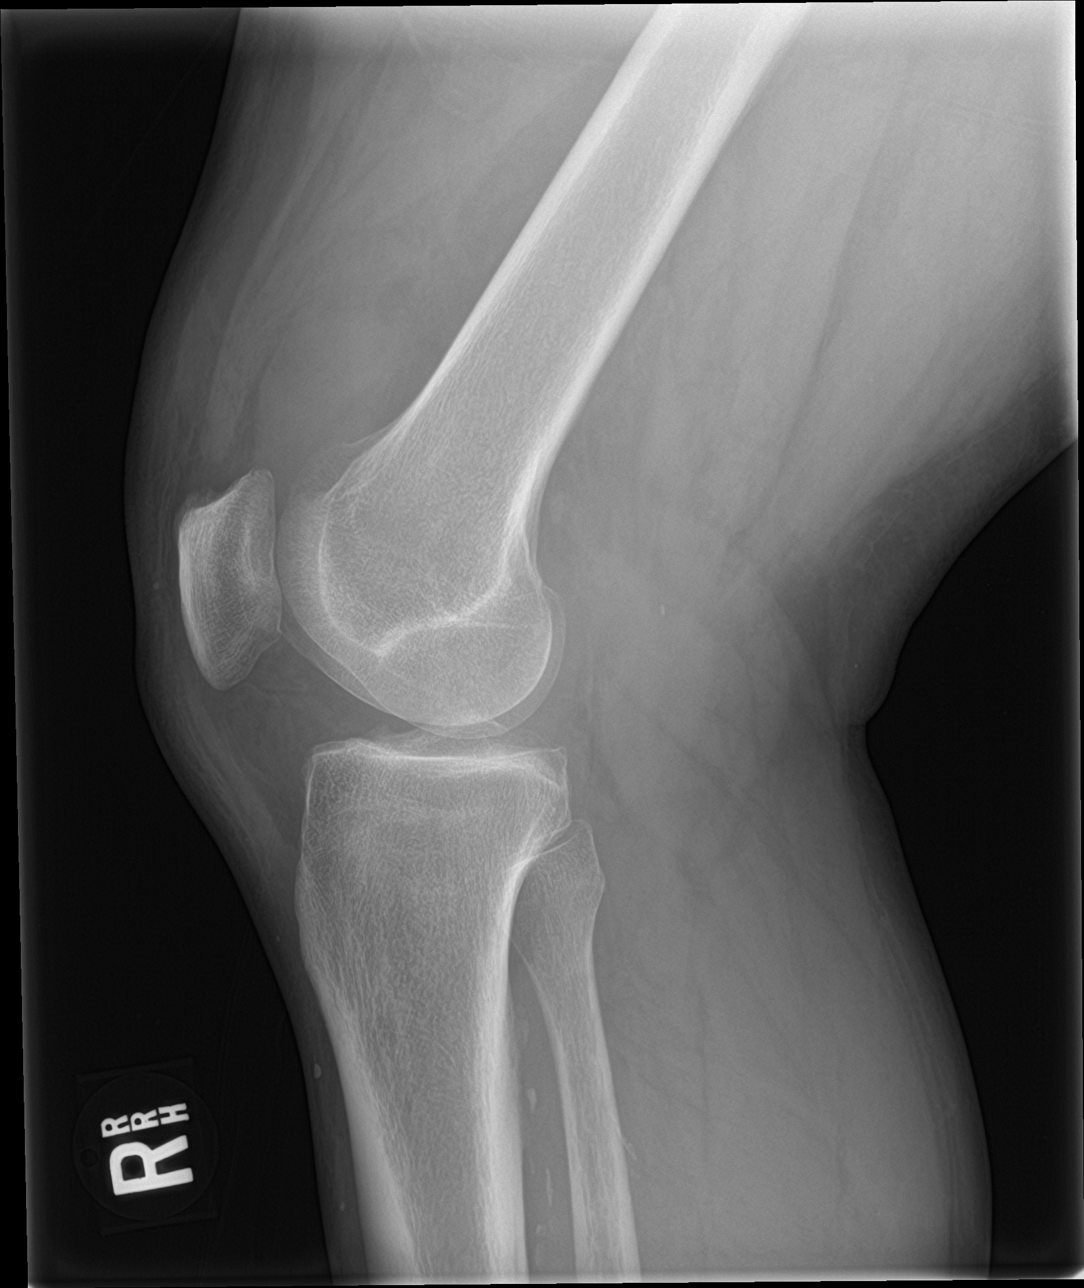

[4 of 4 positions shown; findings below may reference images not displayed]

FINDINGS: Osseous mineralization low normal.

Joint spaces preserved.

No acute fracture, dislocation, or bone destruction.

Large RIGHT knee joint effusion.
IMPRESSION: Large RIGHT knee joint effusion without acute bony findings.

## 2019-11-24 DIAGNOSIS — Z6841 Body Mass Index (BMI) 40.0 and over, adult: Secondary | ICD-10-CM | POA: Diagnosis not present

## 2019-11-24 DIAGNOSIS — F411 Generalized anxiety disorder: Secondary | ICD-10-CM | POA: Diagnosis not present

## 2019-11-24 DIAGNOSIS — Z1389 Encounter for screening for other disorder: Secondary | ICD-10-CM | POA: Diagnosis not present

## 2019-11-24 DIAGNOSIS — E119 Type 2 diabetes mellitus without complications: Secondary | ICD-10-CM | POA: Diagnosis not present

## 2019-11-24 DIAGNOSIS — I10 Essential (primary) hypertension: Secondary | ICD-10-CM | POA: Diagnosis not present

## 2020-02-20 DIAGNOSIS — I10 Essential (primary) hypertension: Secondary | ICD-10-CM | POA: Diagnosis not present

## 2020-02-20 DIAGNOSIS — Z1389 Encounter for screening for other disorder: Secondary | ICD-10-CM | POA: Diagnosis not present

## 2020-02-20 DIAGNOSIS — E119 Type 2 diabetes mellitus without complications: Secondary | ICD-10-CM | POA: Diagnosis not present

## 2020-02-20 DIAGNOSIS — R6889 Other general symptoms and signs: Secondary | ICD-10-CM | POA: Diagnosis not present

## 2020-03-01 DIAGNOSIS — Z0001 Encounter for general adult medical examination with abnormal findings: Secondary | ICD-10-CM | POA: Diagnosis not present

## 2020-03-01 DIAGNOSIS — Z6841 Body Mass Index (BMI) 40.0 and over, adult: Secondary | ICD-10-CM | POA: Diagnosis not present

## 2020-03-01 DIAGNOSIS — E119 Type 2 diabetes mellitus without complications: Secondary | ICD-10-CM | POA: Diagnosis not present

## 2020-03-01 DIAGNOSIS — Z0000 Encounter for general adult medical examination without abnormal findings: Secondary | ICD-10-CM | POA: Diagnosis not present

## 2020-03-01 DIAGNOSIS — I10 Essential (primary) hypertension: Secondary | ICD-10-CM | POA: Diagnosis not present

## 2020-03-01 DIAGNOSIS — E1165 Type 2 diabetes mellitus with hyperglycemia: Secondary | ICD-10-CM | POA: Diagnosis not present

## 2020-06-03 DIAGNOSIS — I10 Essential (primary) hypertension: Secondary | ICD-10-CM | POA: Diagnosis not present

## 2020-06-03 DIAGNOSIS — Z6838 Body mass index (BMI) 38.0-38.9, adult: Secondary | ICD-10-CM | POA: Diagnosis not present

## 2020-06-03 DIAGNOSIS — E119 Type 2 diabetes mellitus without complications: Secondary | ICD-10-CM | POA: Diagnosis not present

## 2020-06-23 ENCOUNTER — Ambulatory Visit: Payer: BC Managed Care – PPO

## 2020-06-23 ENCOUNTER — Encounter: Payer: Self-pay | Admitting: Orthopedic Surgery

## 2020-06-23 ENCOUNTER — Ambulatory Visit (INDEPENDENT_AMBULATORY_CARE_PROVIDER_SITE_OTHER): Payer: BC Managed Care – PPO | Admitting: Orthopedic Surgery

## 2020-06-23 ENCOUNTER — Other Ambulatory Visit: Payer: Self-pay

## 2020-06-23 VITALS — BP 168/97 | HR 78 | Ht 63.0 in | Wt 218.0 lb

## 2020-06-23 DIAGNOSIS — Z716 Tobacco abuse counseling: Secondary | ICD-10-CM

## 2020-06-23 DIAGNOSIS — G8929 Other chronic pain: Secondary | ICD-10-CM

## 2020-06-23 DIAGNOSIS — M25561 Pain in right knee: Secondary | ICD-10-CM

## 2020-06-23 MED ORDER — MELOXICAM 7.5 MG PO TABS: 7.5000 mg | ORAL_TABLET | Freq: Every day | ORAL | 5 refills | Status: DC

## 2020-06-23 NOTE — Patient Instructions (Addendum)
You have received an injection of steroids into the joint. 15% of patients will have increased pain within the 24 hours postinjection.   This is transient and will go away.   We recommend that you use ice packs on the injection site for 20 minutes every 2 hours and extra strength Tylenol 2 tablets every 8 as needed until the pain resolves.  If you continue to have pain after taking the Tylenol and using the ice please call the office for further instructions.    Smoking and Musculoskeletal Health Smoking is bad for your health. Most people know that smoking causes lung disease, heart disease, and cancer. But people may not realize that it also affects their bones, muscles, and joints (musculoskeletal system). When you smoke, the effects on your lungs and heart result in less oxygen for your musculoskeletal system. This can lead to poor bone and joint health. How can smoking affect my musculoskeletal health? Smoking can:  Increase your risk of having weak, thin bones (osteoporosis). Elderly smokers are at higher risk for bone fractures related to osteoporosis.  Decrease the ability of bone-forming cells to make and replace bone (in addition to reducing oxygen and blood flow).  Reduce your body's ability to absorb calcium from your diet. Less calcium means weaker bones.  Interfere with the breakdown of the male hormone estrogen. Smoking lowers estrogen, which is a hormone that helps keep bones strong. Women who smoke may have earlier menopause. Menopause is a risk factor for osteoporosis.  Weaken the tissues that attach bones to muscles (tendons). This can lead to shoulder, back, and other joint injuries.  Increase your risk of rheumatoid arthritis or make the condition worse if you already have it.  Slow down healing and increase your risk of infection and other complications if you have a bone fracture or surgery that involves your musculoskeletal system.  Make you get out of breath  easily. This can keep you from getting the exercise you need to keep your bones and joints healthy.  Decrease your appetite and body mass. You may lose weight and muscle strength. This can put you at higher risk for muscle injury, joint injury, and broken bones. What actions can I take to prevent musculoskeletal problems? Quit smoking      Do not start smoking. Quit if you already do. Even stopping later in life can improve musculoskeletal health.  Do not use any products that contain nicotine or tobacco. Do not replace cigarette smoking with e-cigarettes. The safety of e-cigarettes is not known, and some may contain harmful chemicals.  Make a plan to quit smoking and commit to it. Look for programs to help you, and ask your health care provider for recommendations and ideas.  Talk with your health care provider about using nicotine replacement medicines to help you quit, such as gum, lozenges, patches, sprays, or pills. Make other lifestyle changes   Eat a healthy diet that includes calcium and vitamin D. These nutrients are important for bone health. ? Calcium is found in dairy foods and green leafy vegetables. ? Vitamin D is found in eggs, fish, and liver. ? Many foods also have vitamin D and calcium added to them (are fortified). ? Ask your health care provider if you would benefit from taking a supplement.  Get out in the sunshine for a short time every day. This increases production of vitamin D.  Get 30 minutes of exercise at least 5 days a week. Weight-bearing and strength exercises are best for  musculoskeletal health. Ask your health care provider what type of exercise is safe for you.  Do not drink alcohol if: ? Your health care provider tells you not to drink. ? You are pregnant, may be pregnant, or are planning to become pregnant.  If you drink alcohol, limit how much you have: ? 0-1 drink a day for women. ? 0-2 drinks a day for men.  Be aware of how much alcohol is  in your drink. In the [place] one drink equals one 12 oz bottle of beer (355 mL), one 5 oz glass of wine (148 mL), or one 1 oz glass of hard liquor (44 mL). Where to find more information You may find more information about smoking, musculoskeletal health, and quitting smoking from:  American Academy of Orthopaedic Surgeons: orthoinfo.aaos.org  [employer] of Health, Osteoporosis and Related Bone Diseases [employer]: bones.http://www.myers.net/  HelpGuide.org: helpguide.org  [employer]: smokefree.gov  American Lung Association: lung.org Contact a health care provider if:  You need help to quit smoking. Summary  When you smoke, the effects on your lungs and heart result in less oxygen for your musculoskeletal system.  Even stopping smoking later in life can improve musculoskeletal health.  Do not use any products that contain nicotine or tobacco, such as cigarettes and e-cigarettes.  If you need help quitting, ask your health care provider. This information is not intended to replace advice given to you by your health care provider. Make sure you discuss any questions you have with your health care provider. Document Revised: 06/20/2019 Document Reviewed: 01/21/2018 Elsevier Patient Education  2020 Elsevier Inc.  Osteoarthritis  Osteoarthritis is a type of arthritis that affects tissue that covers the ends of bones in joints (cartilage). Cartilage acts as a cushion between the bones and helps them move smoothly. Osteoarthritis results when cartilage in the joints gets worn down. Osteoarthritis is sometimes called "wear and tear" arthritis. Osteoarthritis is the most common form of arthritis. It often occurs in older people. It is a condition that gets worse over time (a progressive condition). Joints that are most often affected by this condition are in:  Fingers.  Toes.  Hips.  Knees.  Spine, including neck and lower back. What are the causes? This condition is  caused by age-related wearing down of cartilage that covers the ends of bones. What increases the risk? The following factors may make you more likely to develop this condition:  Older age.  Being overweight or obese.  Overuse of joints, such as in athletes.  Past injury of a joint.  Past surgery on a joint.  Family history of osteoarthritis. What are the signs or symptoms? The main symptoms of this condition are pain, swelling, and stiffness in the joint. The joint may lose its shape over time. Small pieces of bone or cartilage may break off and float inside of the joint, which may cause more pain and damage to the joint. Small deposits of bone (osteophytes) may grow on the edges of the joint. Other symptoms may include:  A grating or scraping feeling inside the joint when you move it.  Popping or creaking sounds when you move. Symptoms may affect one or more joints. Osteoarthritis in a major joint, such as your knee or hip, can make it painful to walk or exercise. If you have osteoarthritis in your hands, you might not be able to grip items, twist your hand, or control small movements of your hands and fingers (fine motor skills). How is this diagnosed?  This condition may be diagnosed based on:  Your medical history.  A physical exam.  Your symptoms.  X-rays of the affected joint(s).  Blood tests to rule out other types of arthritis. How is this treated? There is no cure for this condition, but treatment can help to control pain and improve joint function. Treatment plans may include:  A prescribed exercise program that allows for rest and joint relief. You may work with a physical therapist.  A weight control plan.  Pain relief techniques, such as: ? Applying heat and cold to the joint. ? Electric pulses delivered to nerve endings under the skin (transcutaneous electrical nerve stimulation, or TENS). ? Massage. ? Certain nutritional supplements.  NSAIDs or  prescription medicines to help relieve pain.  Medicine to help relieve pain and inflammation (corticosteroids). This can be given by mouth (orally) or as an injection.  Assistive devices, such as a brace, wrap, splint, specialized glove, or cane.  Surgery, such as: ? An osteotomy. This is done to reposition the bones and relieve pain or to remove loose pieces of bone and cartilage. ? Joint replacement surgery. You may need this surgery if you have very bad (advanced) osteoarthritis. Follow these instructions at home: Activity  Rest your affected joints as directed by your health care provider.  Do not drive or use heavy machinery while taking prescription pain medicine.  Exercise as directed. Your health care provider or physical therapist may recommend specific types of exercise, such as: ? Strengthening exercises. These are done to strengthen the muscles that support joints that are affected by arthritis. They can be performed with weights or with exercise bands to add resistance. ? Aerobic activities. These are exercises, such as brisk walking or water aerobics, that get your heart pumping. ? Range-of-motion activities. These keep your joints easy to move. ? Balance and agility exercises. Managing pain, stiffness, and swelling      If directed, apply heat to the affected area as often as told by your health care provider. Use the heat source that your health care provider recommends, such as a moist heat pack or a heating pad. ? If you have a removable assistive device, remove it as told by your health care provider. ? Place a towel between your skin and the heat source. If your health care provider tells you to keep the assistive device on while you apply heat, place a towel between the assistive device and the heat source. ? Leave the heat on for 20-30 minutes. ? Remove the heat if your skin turns bright red. This is especially important if you are unable to feel pain, heat, or  cold. You may have a greater risk of getting burned.  If directed, put ice on the affected joint: ? If you have a removable assistive device, remove it as told by your health care provider. ? Put ice in a plastic bag. ? Place a towel between your skin and the bag. If your health care provider tells you to keep the assistive device on during icing, place a towel between the assistive device and the bag. ? Leave the ice on for 20 minutes, 2-3 times a day. General instructions  Take over-the-counter and prescription medicines only as told by your health care provider.  Maintain a healthy weight. Follow instructions from your health care provider for weight control. These may include dietary restrictions.  Do not use any products that contain nicotine or tobacco, such as cigarettes and e-cigarettes. These can delay bone  healing. If you need help quitting, ask your health care provider.  Use assistive devices as directed by your health care provider.  Keep all follow-up visits as told by your health care provider. This is important. Where to find more information  [employer] of Arthritis and Musculoskeletal and Skin Diseases: www.niams.http://www.myers.net/nih.gov  [employer] on Aging: https://walker.com/www.nia.nih.gov  American College of Rheumatology: www.rheumatology.org Contact a health care provider if:  Your skin turns red.  You develop a rash.  You have pain that gets worse.  You have a fever along with joint or muscle aches. Get help right away if:  You lose a lot of weight.  You suddenly lose your appetite.  You have night sweats. Summary  Osteoarthritis is a type of arthritis that affects tissue covering the ends of bones in joints (cartilage).  This condition is caused by age-related wearing down of cartilage that covers the ends of bones.  The main symptom of this condition is pain, swelling, and stiffness in the joint.  There is no cure for this condition, but treatment can help to  control pain and improve joint function. This information is not intended to replace advice given to you by your health care provider. Make sure you discuss any questions you have with your health care provider. Document Revised: 09/07/2017 Document Reviewed: 05/29/2016 Elsevier Patient Education  2020 [employer]   Start arthritis medication   Meds ordered this encounter  Medications  . meloxicam (MOBIC) 7.5 MG tablet    Sig: Take 1 tablet (7.5 mg total) by mouth daily.    Dispense:  30 tablet    Refill:  5

## 2020-06-23 NOTE — Progress Notes (Signed)
NEW PROBLEM//OFFICE VISIT  Chief Complaint  Patient presents with  . Knee Pain    right knee pain / swelling     33 male 2 years or more of pain and swelling right knee requiring frequent ER visits with aspiration no injection.  Patient was put on steroids after his last visit in 2019  Complains of intermittent swelling and pain with no history of trauma to the right knee  Patient does have hypertension and is a smoker     Review of Systems  All other systems reviewed and are negative.   No Known Allergies  Past Medical History:  Diagnosis Date  . Hypertension     History reviewed. No pertinent surgical history.  Family History  Problem Relation Age of Onset  . CVA Mother   . Diabetes Father    Social History   Tobacco Use  . Smoking status: Current Every Day Smoker    Packs/day: 1.00    Types: Cigarettes  . Smokeless tobacco: Never Used  Substance Use Topics  . Alcohol use: Yes  . Drug use: No    No Known Allergies  Current Meds  Medication Sig  . amLODipine (NORVASC) 10 MG tablet Take 10 mg by mouth daily.  . hydrochlorothiazide (HYDRODIURIL) 25 MG tablet Take 25 mg by mouth daily.  Marland Kitchen ibuprofen (ADVIL,MOTRIN) 200 MG tablet Take 200 mg by mouth every 6 (six) hours as needed for mild pain or moderate pain.    BP (!) 168/97   Pulse 78   Ht 5\' 3"  (1.6 m)   Wt 218 lb (98.9 kg)   BMI 38.62 kg/m   Physical Exam Constitutional:      General: He is not in acute distress.    Appearance: He is well-developed.  Cardiovascular:     Comments: No peripheral edema Musculoskeletal:     Right knee: No effusion.     Instability Tests: Medial McMurray test negative and lateral McMurray test negative.     Left knee: No effusion.  Skin:    General: Skin is warm and dry.     Capillary Refill: Capillary refill takes less than 2 seconds.  Neurological:     Mental Status: He is alert and oriented to person, place, and time.     Sensory: No sensory deficit.      Coordination: Coordination normal.     Gait: Gait normal.     Deep Tendon Reflexes: Reflexes are normal and symmetric.  Psychiatric:        Mood and Affect: Mood normal.        Behavior: Behavior normal.        Thought Content: Thought content normal.        Judgment: Judgment normal.     Right Knee Exam   Muscle Strength  The patient has normal right knee strength.  Tenderness  The patient is experiencing tenderness in the medial joint line.  Range of Motion  Extension: normal  Flexion: normal   Tests  McMurray:  Medial - negative Lateral - negative Drawer:  Anterior - negative    Posterior - negative  Other  Erythema: absent Scars: absent Sensation: normal Pulse: present Swelling: none Effusion: no effusion present   Left Knee Exam  Left knee exam is normal.  Tenderness  The patient is experiencing no tenderness.   Range of Motion  Extension: normal  Flexion: normal   Tests  Drawer:  Anterior - negative     Posterior - negative  Other  Effusion: no effusion present        MEDICAL DECISION MAKING  A.  Encounter Diagnoses  Name Primary?  . Chronic pain of right knee Yes  . Tobacco abuse counseling     B. DATA ANALYSED:   IMAGING: Interpretation of images: X-ray date July 12, 2018 [facility] multiple views of the right knee taken at that time for pain swelling and difficulty weightbearing and there is a large joint effusion with no bony abnormality   Outside records reviewed: Emergency room record indicates patient had arthrocentesis at this time  fluid was yellow turbid 15,000 white cells 97% neutrophils Gram stain negative no mention of crystal analysis  C. MANAGEMENT   Differential diagnosis includes inflammatory arthritis, currently shows mild degenerative changes in the joint  No history of trauma  Start with medication and injection smoking cessation counseling weight counseling  Come back in 3 months  Meds ordered  this encounter  Medications  . meloxicam (MOBIC) 7.5 MG tablet    Sig: Take 1 tablet (7.5 mg total) by mouth daily.    Dispense:  30 tablet    Refill:  5   Procedure note right knee injection   verbal consent was obtained to inject right knee joint  Timeout was completed to confirm the site of injection  The medications used were 40 mg of Depo-Medrol and 1% lidocaine 3 cc  Anesthesia was provided by ethyl chloride and the skin was prepped with alcohol.  After cleaning the skin with alcohol a 20-gauge needle was used to inject the right knee joint. There were no complications. A sterile bandage was applied.    Fuller Canada, MD  06/23/2020 9:45 AM

## 2020-07-30 ENCOUNTER — Telehealth: Payer: Self-pay | Admitting: Orthopedic Surgery

## 2020-07-30 NOTE — Telephone Encounter (Signed)
Patient called complaining of his knee stating he is in A LOT of pain and the medicine that Dr. Romeo Apple has him on is not working and he needs something else.  Advised  patient he is scheduled for appt in December.  We offered patient appt sooner.   Patient states he didn't know if Dr. Romeo Apple could just call him in some pain medicine.  Advised patient if he is in that much pain he might want to call his PCP or go to Urgent Care.  He said he doesn't ask his PCP for pain medicine he thought since Dr. Romeo Apple is his knee doctor he would just call him in some pain medicine.  He will just wait and keep his appt in December.

## 2020-08-02 ENCOUNTER — Other Ambulatory Visit: Payer: Self-pay | Admitting: Orthopedic Surgery

## 2020-08-02 DIAGNOSIS — G8929 Other chronic pain: Secondary | ICD-10-CM

## 2020-08-02 MED ORDER — TRAMADOL-ACETAMINOPHEN 37.5-325 MG PO TABS: 1.0000 | ORAL_TABLET | ORAL | 0 refills | Status: AC | PRN

## 2020-08-02 NOTE — Progress Notes (Signed)
ultracet  Meds ordered this encounter  Medications  . traMADol-acetaminophen (ULTRACET) 37.5-325 MG tablet    Sig: Take 1 tablet by mouth every 4 (four) hours as needed for up to 5 days.    Dispense:  30 tablet    Refill:  0

## 2020-08-02 NOTE — Telephone Encounter (Signed)
I ll call in something

## 2020-09-22 ENCOUNTER — Ambulatory Visit: Payer: BC Managed Care – PPO | Admitting: Orthopedic Surgery

## 2020-10-07 ENCOUNTER — Other Ambulatory Visit: Payer: Self-pay

## 2020-10-07 ENCOUNTER — Ambulatory Visit (INDEPENDENT_AMBULATORY_CARE_PROVIDER_SITE_OTHER): Payer: BC Managed Care – PPO | Admitting: Orthopedic Surgery

## 2020-10-07 ENCOUNTER — Encounter: Payer: Self-pay | Admitting: Orthopedic Surgery

## 2020-10-07 VITALS — BP 181/118 | HR 97 | Ht 63.0 in | Wt 218.0 lb

## 2020-10-07 DIAGNOSIS — M25561 Pain in right knee: Secondary | ICD-10-CM | POA: Diagnosis not present

## 2020-10-07 DIAGNOSIS — G8929 Other chronic pain: Secondary | ICD-10-CM

## 2020-10-07 DIAGNOSIS — M23321 Other meniscus derangements, posterior horn of medial meniscus, right knee: Secondary | ICD-10-CM

## 2020-10-07 NOTE — Progress Notes (Signed)
FOLLOW-UP OFFICE VISIT   Encounter Diagnosis  Name Primary?  . Chronic pain of right knee Yes    49 year old male with chronic swelling of his knee, status post aspiration injection and treatment with meloxicam comes in for follow-up x-rays show minimal degenerative changes  (and prior treatment)  + EXAM FINDINGS: No joint effusion full range of motion however the patient has a lot of tenderness over the medial joint line.  His ligaments feel stable.  Review of systems after limping the patient will notice some pain running up into his right hip  ASSESSMENT AND PLAN Probable medial meniscus tear recommend MRI  Secondary right lower extremity radicular pain caused by the limping secondary to the chronic knee effusion with likely meniscal tear  Patient will continue with ibuprofen seems to get better relief from that

## 2020-10-07 NOTE — Patient Instructions (Signed)
Continue with ibuprofen MRI will be scheduled

## 2020-10-12 ENCOUNTER — Ambulatory Visit (HOSPITAL_COMMUNITY): Payer: BC Managed Care – PPO | Attending: Orthopedic Surgery

## 2020-12-06 DIAGNOSIS — Z1331 Encounter for screening for depression: Secondary | ICD-10-CM | POA: Diagnosis not present

## 2020-12-06 DIAGNOSIS — Z1389 Encounter for screening for other disorder: Secondary | ICD-10-CM | POA: Diagnosis not present

## 2020-12-06 DIAGNOSIS — Z6841 Body Mass Index (BMI) 40.0 and over, adult: Secondary | ICD-10-CM | POA: Diagnosis not present

## 2020-12-06 DIAGNOSIS — E1165 Type 2 diabetes mellitus with hyperglycemia: Secondary | ICD-10-CM | POA: Diagnosis not present

## 2020-12-06 DIAGNOSIS — I10 Essential (primary) hypertension: Secondary | ICD-10-CM | POA: Diagnosis not present

## 2020-12-17 ENCOUNTER — Other Ambulatory Visit: Payer: Self-pay

## 2020-12-17 ENCOUNTER — Encounter (HOSPITAL_COMMUNITY): Payer: Self-pay | Admitting: *Deleted

## 2020-12-17 ENCOUNTER — Emergency Department (HOSPITAL_COMMUNITY)
Admission: EM | Admit: 2020-12-17 | Discharge: 2020-12-17 | Disposition: A | Discharge status: Home / Self Care | Payer: BC Managed Care – PPO | Attending: Emergency Medicine | Admitting: Emergency Medicine

## 2020-12-17 ENCOUNTER — Emergency Department (HOSPITAL_COMMUNITY): Payer: BC Managed Care – PPO

## 2020-12-17 DIAGNOSIS — Z79899 Other long term (current) drug therapy: Secondary | ICD-10-CM | POA: Insufficient documentation

## 2020-12-17 DIAGNOSIS — E119 Type 2 diabetes mellitus without complications: Secondary | ICD-10-CM | POA: Insufficient documentation

## 2020-12-17 DIAGNOSIS — Z720 Tobacco use: Secondary | ICD-10-CM | POA: Diagnosis not present

## 2020-12-17 DIAGNOSIS — R519 Headache, unspecified: Secondary | ICD-10-CM | POA: Diagnosis not present

## 2020-12-17 DIAGNOSIS — I10 Essential (primary) hypertension: Secondary | ICD-10-CM | POA: Diagnosis not present

## 2020-12-17 DIAGNOSIS — F17210 Nicotine dependence, cigarettes, uncomplicated: Secondary | ICD-10-CM | POA: Insufficient documentation

## 2020-12-17 DIAGNOSIS — M62838 Other muscle spasm: Secondary | ICD-10-CM | POA: Diagnosis not present

## 2020-12-17 DIAGNOSIS — M5011 Cervical disc disorder with radiculopathy,  high cervical region: Secondary | ICD-10-CM | POA: Diagnosis not present

## 2020-12-17 HISTORY — DX: Type 2 diabetes mellitus without complications: E11.9

## 2020-12-17 MED ORDER — KETOROLAC TROMETHAMINE 30 MG/ML IJ SOLN
30.0000 mg | Freq: Once | INTRAMUSCULAR | Status: AC
  Administered 2020-12-17: 30 mg via INTRAMUSCULAR
  Filled 2020-12-17: qty 1

## 2020-12-17 MED ORDER — DEXAMETHASONE SODIUM PHOSPHATE 10 MG/ML IJ SOLN
10.0000 mg | Freq: Once | INTRAMUSCULAR | Status: AC
  Administered 2020-12-17: 10 mg via INTRAMUSCULAR
  Filled 2020-12-17: qty 1

## 2020-12-17 MED ORDER — FLUTICASONE PROPIONATE 50 MCG/ACT NA SUSP: 1.0000 | Freq: Every day | NASAL | 0 refills | Status: DC

## 2020-12-17 MED ORDER — LORATADINE 10 MG PO TABS
10.0000 mg | ORAL_TABLET | Freq: Once | ORAL | Status: AC
  Administered 2020-12-17: 10 mg via ORAL
  Filled 2020-12-17: qty 1

## 2020-12-17 MED ORDER — PREDNISONE 10 MG (21) PO TBPK: ORAL_TABLET | ORAL | 0 refills | Status: DC

## 2020-12-17 MED ORDER — OXYMETAZOLINE HCL 0.05 % NA SOLN
1.0000 | Freq: Once | NASAL | Status: AC
  Administered 2020-12-17: 1 via NASAL

## 2020-12-17 NOTE — ED Triage Notes (Signed)
Headache for 2 weeks with pain in neck

## 2020-12-17 NOTE — ED Provider Notes (Signed)
[facility] EMERGENCY DEPARTMENT Provider Note   CSN: [id number] Arrival date & time: 12/17/20  1714     History Chief Complaint  Patient presents with  . Headache    Alec Howell is a 50 y.o. male.  Pt presents to the ED today with a headache and neck pain.  Pt said it's been hurting him for 2 weeks.  He said it started in his right ear and thought he had an ear infection.  The pt said he called his doctor who prescribed abx and tramadol.  He is still having pain and was worried he had an aneurysm, so he came in today.  Pt denies any fever.  No numbness in his arms.  No weakness.        Past Medical History:  Diagnosis Date  . Diabetes mellitus without complication (HCC)   . Hypertension     There are no problems to display for this patient.   History reviewed. No pertinent surgical history.     Family History  Problem Relation Age of Onset  . CVA Mother   . Diabetes Father     Social History   Tobacco Use  . Smoking status: Current Every Day Smoker    Packs/day: 1.00    Types: Cigarettes  . Smokeless tobacco: Never Used  Substance Use Topics  . Alcohol use: Yes  . Drug use: No    Home Medications Prior to Admission medications   Medication Sig Start Date End Date Taking? Authorizing Provider  amLODipine (NORVASC) 10 MG tablet Take 10 mg by mouth daily.   Yes [provider]  amoxicillin-clavulanate (AUGMENTIN) 875-125 MG tablet Take 1 tablet by mouth 2 (two) times daily. 12/16/20  Yes [provider]  fluticasone (FLONASE) 50 MCG/ACT nasal spray Place 1 spray into both nostrils daily. 12/17/20  Yes Jacalyn Lefevre, MD  hydrochlorothiazide (HYDRODIURIL) 25 MG tablet Take 50 mg by mouth daily.   Yes [provider]  ibuprofen (ADVIL,MOTRIN) 200 MG tablet Take 200 mg by mouth every 6 (six) hours as needed for mild pain or moderate pain.   Yes [provider]  predniSONE (STERAPRED UNI-PAK 21 TAB) 10 MG (21) TBPK tablet  Take 6 tabs for 2 days, then 5 for 2 days, then 4 for 2 days, then 3 for 2 days, 2 for 2 days, then 1 for 2 days 12/17/20  Yes Jacalyn Lefevre, MD  traMADol-acetaminophen (ULTRACET) 37.5-325 MG tablet Take 1 tablet by mouth every 6 (six) hours as needed.   Yes [provider]  meloxicam (MOBIC) 7.5 MG tablet Take 1 tablet (7.5 mg total) by mouth daily. Patient not taking: No sig reported 06/23/20   Vickki Hearing, MD    Allergies    Patient has no known allergies.  Review of Systems   Review of Systems  Musculoskeletal: Positive for neck pain.  Neurological: Positive for headaches.  All other systems reviewed and are negative.   Physical Exam Updated Vital Signs BP (!) 158/95   Pulse 75   Temp 97.8 F (36.6 C) (Oral)   Resp 18   SpO2 100%   Physical Exam Vitals and nursing note reviewed.  Constitutional:      Appearance: He is well-developed.  HENT:     Head: Normocephalic and atraumatic.     Mouth/Throat:     Mouth: Mucous membranes are moist.     Pharynx: Oropharynx is clear.  Eyes:     Extraocular Movements: Extraocular movements intact.  Pupils: Pupils are equal, round, and reactive to light.  Neck:   Cardiovascular:     Rate and Rhythm: Normal rate and regular rhythm.  Pulmonary:     Effort: Pulmonary effort is normal.     Breath sounds: Normal breath sounds.  Abdominal:     Palpations: Abdomen is soft.  Musculoskeletal:        General: Normal range of motion.     Cervical back: Normal range of motion and neck supple.  Skin:    General: Skin is warm.     Capillary Refill: Capillary refill takes less than 2 seconds.  Neurological:     Mental Status: He is alert and oriented to person, place, and time.  Psychiatric:        Mood and Affect: Mood normal.        Speech: Speech normal.        Behavior: Behavior normal.     ED Results / Procedures / Treatments   Labs (all labs ordered are listed, but only abnormal results are  displayed) Labs Reviewed - No data to display  EKG None  Radiology CT Head Wo Contrast  Result Date: 12/17/2020 CLINICAL DATA:  Headache, new or worsening, positional (Age 63-49y) EXAM: CT HEAD WITHOUT CONTRAST TECHNIQUE: Contiguous axial images were obtained from the base of the skull through the vertex without intravenous contrast. COMPARISON:  Head CT 02/27/2010 FINDINGS: Brain: There is a remote lacunar infarct in the right caudate. No intracranial hemorrhage, mass effect, or midline shift. No hydrocephalus. The basilar cisterns are patent. No evidence of territorial infarct or acute ischemia. No extra-axial or intracranial fluid collection. Vascular: Atherosclerosis of skull base vasculature, age advanced. No hyperdense vessel. Skull: No fracture or focal lesion. Sinuses/Orbits: Mastoid air cells appear hypo pneumatized with scattered opacification, similar to remote prior exam. 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 Other: None. IMPRESSION: 1. No acute intracranial abnormality. 2. 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 3. 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 Electronically Signed   By: Narda Rutherford M.D.   On: 12/17/2020 20:01   CT Cervical Spine Wo Contrast  Result Date: 12/17/2020 CLINICAL DATA:  Cervical radiculopathy, no red flags EXAM: CT CERVICAL SPINE WITHOUT CONTRAST TECHNIQUE: Multidetector CT imaging of the cervical spine was performed without intravenous contrast. Multiplanar CT image reconstructions were also generated. COMPARISON:  None. FINDINGS: Alignment: Straightening of normal lordosis.  No listhesis. Skull base and vertebrae: No acute fracture. No primary bone lesion or focal pathologic process. Soft tissues and spinal canal: No prevertebral fluid or swelling. No visible canal hematoma. Disc levels: Endplate spurring with mild disc space narrowing at C3-C4. Small posteriorly directed osteophytes. Mild right  bony neural foraminal narrowing. No narrowing the spinal canal.Scratch Upper chest: No acute findings. Other: None. IMPRESSION: 1. Mild degenerative disc disease at C3-C4 with mild right bony neural foraminal narrowing. 2. Straightening of normal lordosis may be due to positioning or muscle spasm. Electronically Signed   By: Narda Rutherford M.D.   On: 12/17/2020 20:05    Procedures Procedures   Medications Ordered in ED Medications  oxymetazoline (AFRIN) 0.05 % nasal spray 1 spray (has no administration in time range)  loratadine (CLARITIN) tablet 10 mg (10 mg Oral Given 12/17/20 1921)  dexamethasone (DECADRON) injection 10 mg (10 mg Intramuscular Given 12/17/20 1922)  ketorolac (TORADOL) 30 MG/ML injection 30 mg (30 mg Intramuscular Given 12/17/20 1924)    ED Course  I have reviewed the  triage vital signs and the nursing notes.  Pertinent labs & imaging results that were available during my care of the patient were reviewed by me and considered in my medical decision making (see chart for details).    MDM Rules/Calculators/A&P                          Pt is encouraged to stop smoking.  He is given afrin prior to d/c and is d/c on flonase and prednisone.  Return if worse.  Final Clinical Impression(s) / ED Diagnoses Final diagnoses:  Acute nonintractable headache, unspecified headache type  Cervical paraspinous muscle spasm  Tobacco abuse    Rx / DC Orders ED Discharge Orders         Ordered    fluticasone (FLONASE) 50 MCG/ACT nasal spray  Daily        12/17/20 2018    predniSONE (STERAPRED UNI-PAK 21 TAB) 10 MG (21) TBPK tablet        12/17/20 2018           Jacalyn Lefevre, MD 12/17/20 2019

## 2020-12-17 NOTE — Discharge Instructions (Addendum)
Try to stop smoking. °

## 2021-05-17 DIAGNOSIS — H5789 Other specified disorders of eye and adnexa: Secondary | ICD-10-CM | POA: Diagnosis not present

## 2021-05-17 DIAGNOSIS — Z6838 Body mass index (BMI) 38.0-38.9, adult: Secondary | ICD-10-CM | POA: Diagnosis not present

## 2021-05-17 DIAGNOSIS — B359 Dermatophytosis, unspecified: Secondary | ICD-10-CM | POA: Diagnosis not present

## 2021-05-17 DIAGNOSIS — Z0000 Encounter for general adult medical examination without abnormal findings: Secondary | ICD-10-CM | POA: Diagnosis not present

## 2021-05-17 DIAGNOSIS — R7301 Impaired fasting glucose: Secondary | ICD-10-CM | POA: Diagnosis not present

## 2022-03-07 IMAGING — CT CT CERVICAL SPINE W/O CM
3 of 4 series · 11 of 33 positions shown, 13 images · non-contrast
Comparison: None.

CLINICAL DATA: Cervical radiculopathy, no red flags

EXAM:
CT CERVICAL SPINE WITHOUT CONTRAST
TECHNIQUE: Multidetector CT imaging of the cervical spine was performed without
intravenous contrast. Multiplanar CT image reconstructions were also
generated.

[Series 5: sagittal bone · sagittal · 0.22mm/px · 5 of 61 slices shown, 6 images]
[im 21/61  bone]
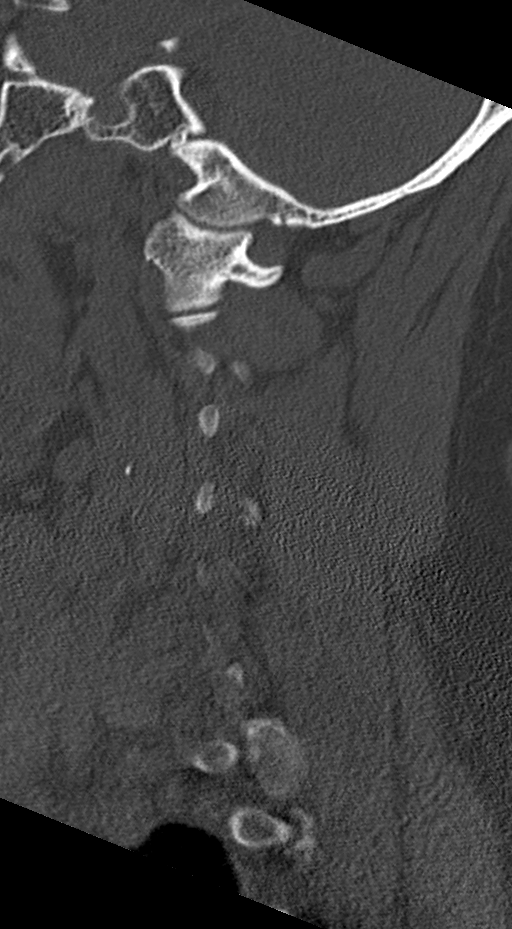
[im 26/61  bone]
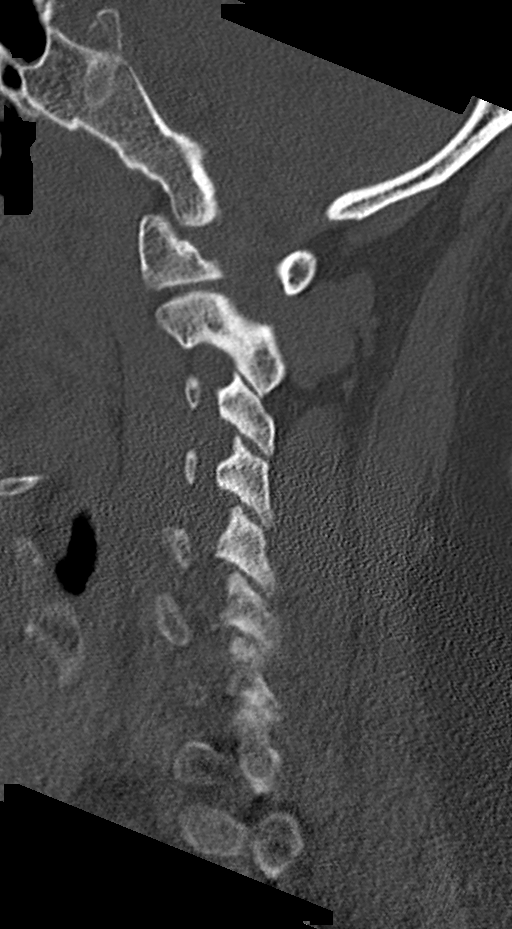
[im 31/61  soft-tissue]
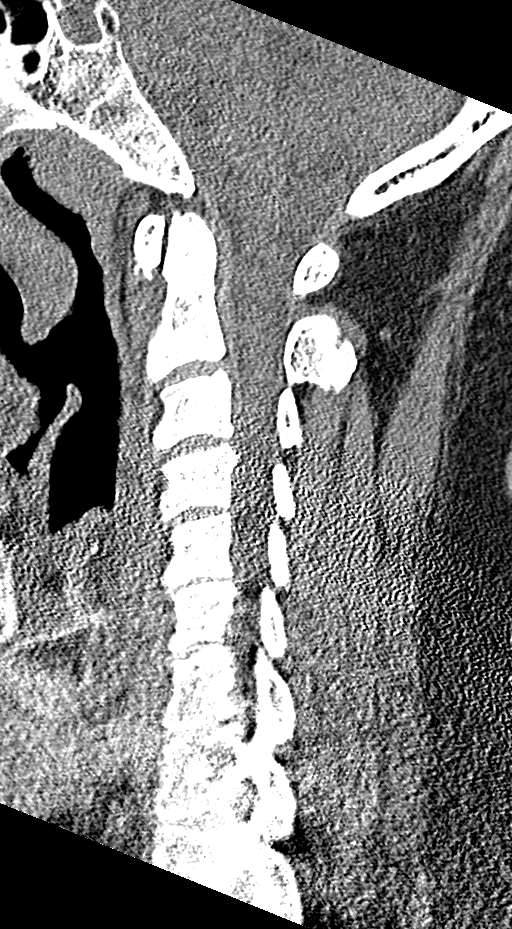
[im 31/61  bone]
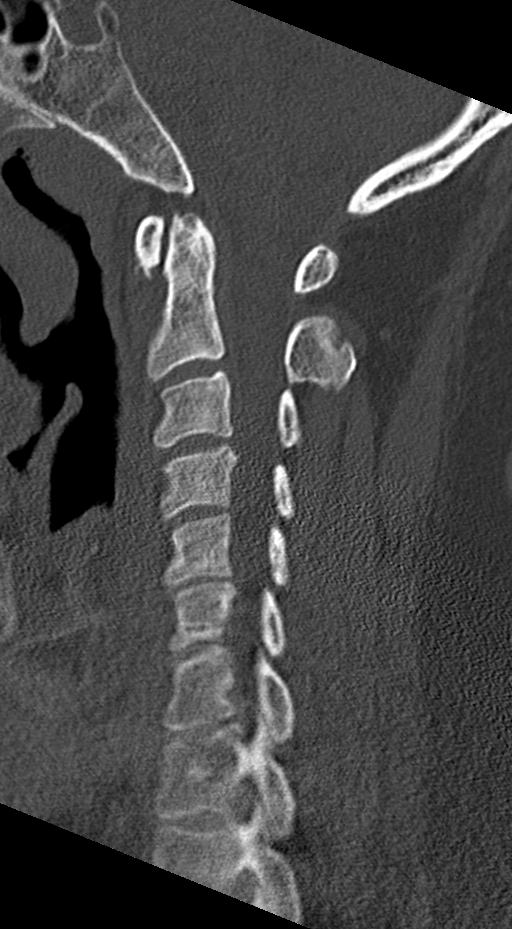
[im 36/61  bone]
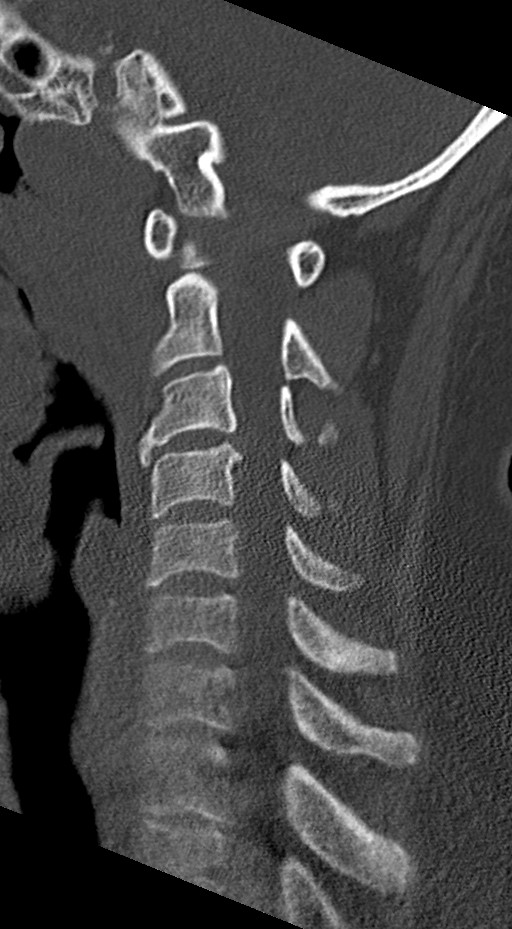
[im 41/61  bone]
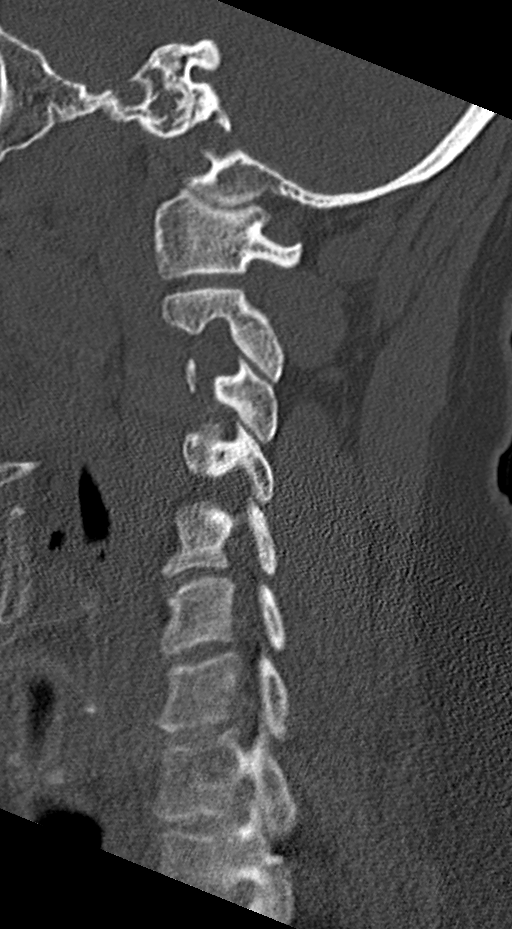

[Series 6: coronal bone · coronal · 0.24mm/px · 3 of 61 slices shown]
[im 13/61  bone]
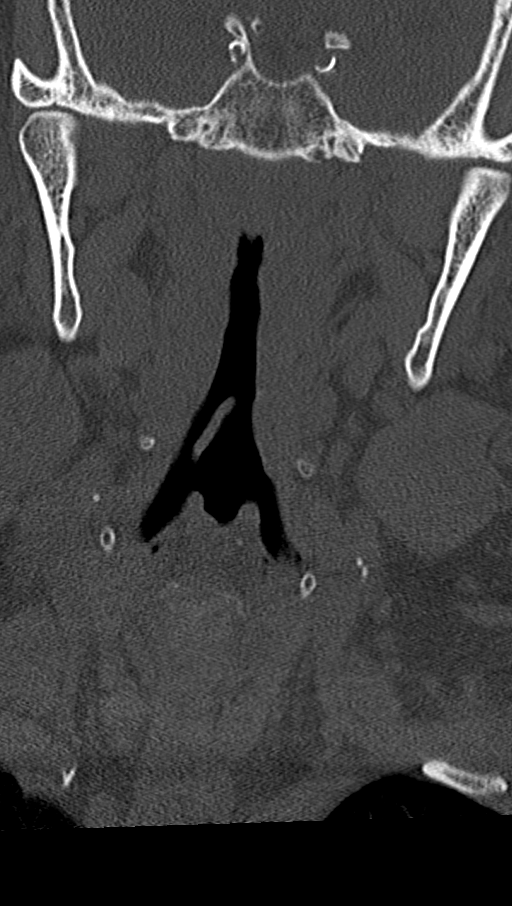
[im 25/61  bone]
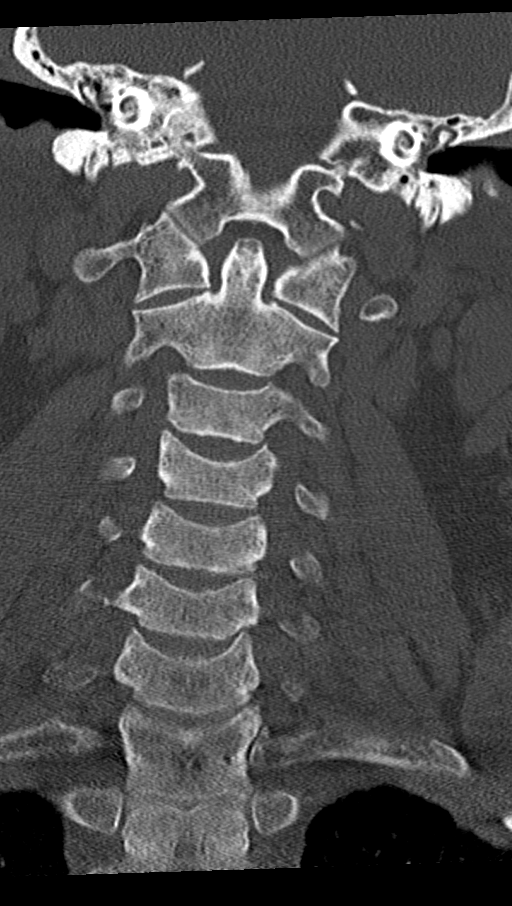
[im 36/61  bone]
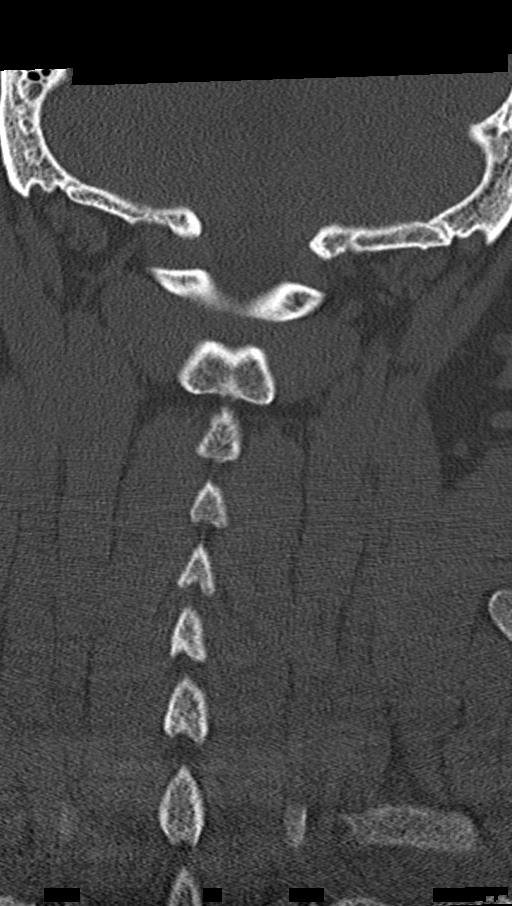

[Series 7: orthogonal axials · axial · 0.21mm/px · z∈[-79,+5]mm · 3 of 79 slices shown, 4 images]
[im 16/79  soft-tissue]
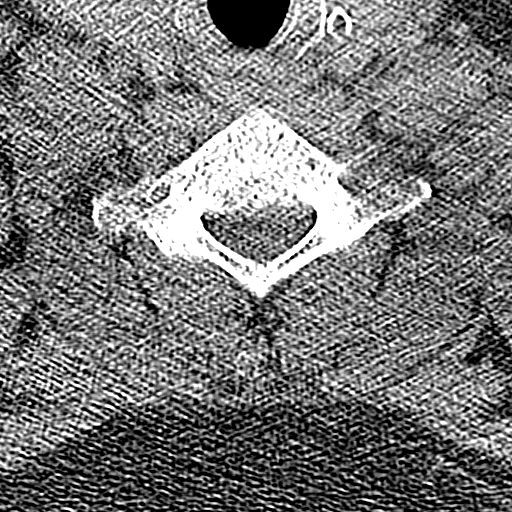
[im 16/79  bone]
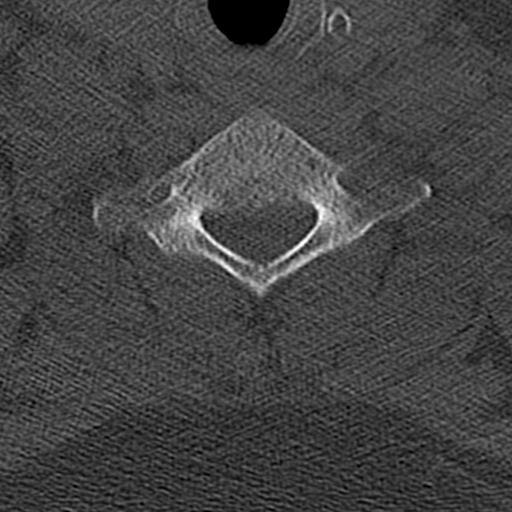
[im 47/79  bone]
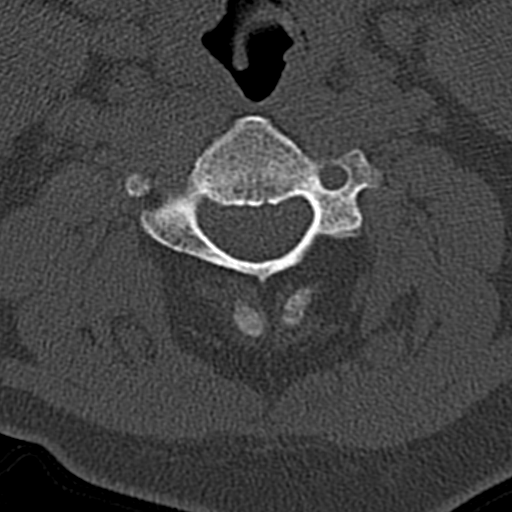
[im 63/79  bone]
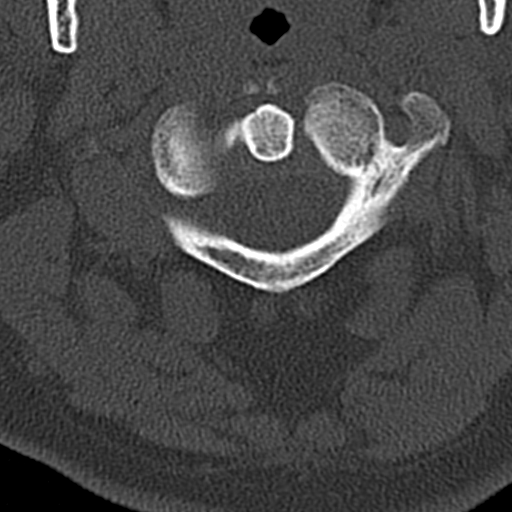

[11 of 33 positions shown; findings below may reference images not displayed]

FINDINGS: Alignment: Straightening of normal lordosis.  No listhesis.

Skull base and vertebrae: No acute fracture. No primary bone lesion
or focal pathologic process.

Soft tissues and spinal canal: No prevertebral fluid or swelling. No
visible canal hematoma.

Disc levels: Endplate spurring with mild disc space narrowing at
C3-C4. Small posteriorly directed osteophytes. Mild right bony
neural foraminal narrowing. No narrowing the spinal canal.Scratch

Upper chest: No acute findings.

Other: None.
IMPRESSION: 1. Mild degenerative disc disease at C3-C4 with mild right bony
neural foraminal narrowing.
2. Straightening of normal lordosis may be due to positioning or
muscle spasm.

## 2022-04-18 DIAGNOSIS — I10 Essential (primary) hypertension: Secondary | ICD-10-CM | POA: Diagnosis not present

## 2022-04-18 DIAGNOSIS — Z0001 Encounter for general adult medical examination with abnormal findings: Secondary | ICD-10-CM | POA: Diagnosis not present

## 2022-04-18 DIAGNOSIS — Z0000 Encounter for general adult medical examination without abnormal findings: Secondary | ICD-10-CM | POA: Diagnosis not present

## 2022-04-18 DIAGNOSIS — E785 Hyperlipidemia, unspecified: Secondary | ICD-10-CM | POA: Diagnosis not present

## 2022-04-18 DIAGNOSIS — Z1331 Encounter for screening for depression: Secondary | ICD-10-CM | POA: Diagnosis not present

## 2022-04-18 DIAGNOSIS — E1165 Type 2 diabetes mellitus with hyperglycemia: Secondary | ICD-10-CM | POA: Diagnosis not present

## 2022-04-18 DIAGNOSIS — Z6838 Body mass index (BMI) 38.0-38.9, adult: Secondary | ICD-10-CM | POA: Diagnosis not present

## 2022-04-20 DIAGNOSIS — H01002 Unspecified blepharitis right lower eyelid: Secondary | ICD-10-CM | POA: Diagnosis not present

## 2022-04-20 DIAGNOSIS — H01001 Unspecified blepharitis right upper eyelid: Secondary | ICD-10-CM | POA: Diagnosis not present

## 2022-04-20 DIAGNOSIS — H01004 Unspecified blepharitis left upper eyelid: Secondary | ICD-10-CM | POA: Diagnosis not present

## 2022-04-20 DIAGNOSIS — H40013 Open angle with borderline findings, low risk, bilateral: Secondary | ICD-10-CM | POA: Diagnosis not present

## 2022-07-24 DIAGNOSIS — H01004 Unspecified blepharitis left upper eyelid: Secondary | ICD-10-CM | POA: Diagnosis not present

## 2022-07-24 DIAGNOSIS — H01002 Unspecified blepharitis right lower eyelid: Secondary | ICD-10-CM | POA: Diagnosis not present

## 2022-07-24 DIAGNOSIS — H01001 Unspecified blepharitis right upper eyelid: Secondary | ICD-10-CM | POA: Diagnosis not present

## 2022-07-24 DIAGNOSIS — H40013 Open angle with borderline findings, low risk, bilateral: Secondary | ICD-10-CM | POA: Diagnosis not present

## 2022-12-07 ENCOUNTER — Encounter: Payer: Self-pay | Admitting: Radiology

## 2023-01-15 DIAGNOSIS — E785 Hyperlipidemia, unspecified: Secondary | ICD-10-CM | POA: Diagnosis not present

## 2023-01-15 DIAGNOSIS — E6609 Other obesity due to excess calories: Secondary | ICD-10-CM | POA: Diagnosis not present

## 2023-01-15 DIAGNOSIS — I10 Essential (primary) hypertension: Secondary | ICD-10-CM | POA: Diagnosis not present

## 2023-01-15 DIAGNOSIS — E1165 Type 2 diabetes mellitus with hyperglycemia: Secondary | ICD-10-CM | POA: Diagnosis not present

## 2023-01-15 DIAGNOSIS — Z6837 Body mass index (BMI) 37.0-37.9, adult: Secondary | ICD-10-CM | POA: Diagnosis not present

## 2023-01-16 ENCOUNTER — Emergency Department (HOSPITAL_COMMUNITY): Payer: BC Managed Care – PPO

## 2023-01-16 ENCOUNTER — Encounter (HOSPITAL_COMMUNITY): Payer: Self-pay

## 2023-01-16 ENCOUNTER — Other Ambulatory Visit: Payer: Self-pay

## 2023-01-16 ENCOUNTER — Emergency Department (HOSPITAL_COMMUNITY)
Admission: EM | Admit: 2023-01-16 | Discharge: 2023-01-16 | Disposition: A | Discharge status: Home / Self Care | Payer: BC Managed Care – PPO | Attending: Emergency Medicine | Admitting: Emergency Medicine

## 2023-01-16 DIAGNOSIS — D72829 Elevated white blood cell count, unspecified: Secondary | ICD-10-CM | POA: Diagnosis not present

## 2023-01-16 DIAGNOSIS — E871 Hypo-osmolality and hyponatremia: Secondary | ICD-10-CM | POA: Diagnosis not present

## 2023-01-16 DIAGNOSIS — Z79899 Other long term (current) drug therapy: Secondary | ICD-10-CM | POA: Diagnosis not present

## 2023-01-16 DIAGNOSIS — K760 Fatty (change of) liver, not elsewhere classified: Secondary | ICD-10-CM | POA: Diagnosis not present

## 2023-01-16 DIAGNOSIS — K529 Noninfective gastroenteritis and colitis, unspecified: Secondary | ICD-10-CM

## 2023-01-16 DIAGNOSIS — E1165 Type 2 diabetes mellitus with hyperglycemia: Secondary | ICD-10-CM | POA: Diagnosis not present

## 2023-01-16 DIAGNOSIS — R748 Abnormal levels of other serum enzymes: Secondary | ICD-10-CM

## 2023-01-16 DIAGNOSIS — R1032 Left lower quadrant pain: Secondary | ICD-10-CM | POA: Diagnosis not present

## 2023-01-16 DIAGNOSIS — R7401 Elevation of levels of liver transaminase levels: Secondary | ICD-10-CM | POA: Insufficient documentation

## 2023-01-16 DIAGNOSIS — I10 Essential (primary) hypertension: Secondary | ICD-10-CM | POA: Diagnosis not present

## 2023-01-16 DIAGNOSIS — D696 Thrombocytopenia, unspecified: Secondary | ICD-10-CM | POA: Diagnosis not present

## 2023-01-16 DIAGNOSIS — K5730 Diverticulosis of large intestine without perforation or abscess without bleeding: Secondary | ICD-10-CM | POA: Diagnosis not present

## 2023-01-16 DIAGNOSIS — F17200 Nicotine dependence, unspecified, uncomplicated: Secondary | ICD-10-CM | POA: Diagnosis not present

## 2023-01-16 LAB — COMPREHENSIVE METABOLIC PANEL
ALT: 68 U/L — ABNORMAL HIGH (ref 0–44)
AST: 45 U/L — ABNORMAL HIGH (ref 15–41)
Albumin: 4.1 g/dL (ref 3.5–5.0)
Alkaline Phosphatase: 72 U/L (ref 38–126)
Anion gap: 10 (ref 5–15)
BUN: 11 mg/dL (ref 6–20)
CO2: 24 mmol/L (ref 22–32)
Calcium: 9 mg/dL (ref 8.9–10.3)
Chloride: 97 mmol/L — ABNORMAL LOW (ref 98–111)
Creatinine, Ser: 0.9 mg/dL (ref 0.61–1.24)
GFR, Estimated: >60 mL/min (ref >60)
Glucose, Bld: 177 mg/dL — ABNORMAL HIGH (ref 70–99)
Potassium: 3.7 mmol/L (ref 3.5–5.1)
Sodium: 131 mmol/L — ABNORMAL LOW (ref 135–145)
Total Bilirubin: 0.9 mg/dL (ref 0.3–1.2)
Total Protein: 7.9 g/dL (ref 6.5–8.1)

## 2023-01-16 LAB — URINALYSIS, ROUTINE W REFLEX MICROSCOPIC
Bilirubin Urine: NEGATIVE
Glucose, UA: 50 mg/dL — AB
Hgb urine dipstick: NEGATIVE
Ketones, ur: NEGATIVE mg/dL
Leukocytes,Ua: NEGATIVE
Nitrite: NEGATIVE
Protein, ur: NEGATIVE mg/dL
Specific Gravity, Urine: 1.036 — ABNORMAL HIGH (ref 1.005–1.030)
pH: 6 (ref 5.0–8.0)

## 2023-01-16 LAB — CBC
HCT: 47.1 % (ref 39.0–52.0)
Hemoglobin: 16.1 g/dL (ref 13.0–17.0)
MCH: 29.4 pg (ref 26.0–34.0)
MCHC: 34.2 g/dL (ref 30.0–36.0)
MCV: 85.9 fL (ref 80.0–100.0)
Platelets: 126 10*3/uL — ABNORMAL LOW (ref 150–400)
RBC: 5.48 MIL/uL (ref 4.22–5.81)
RDW: 12.7 % (ref 11.5–15.5)
WBC: 11.8 10*3/uL — ABNORMAL HIGH (ref 4.0–10.5)
nRBC: 0 % (ref 0.0–0.2)

## 2023-01-16 LAB — LIPASE, BLOOD: Lipase: 32 U/L (ref 11–51)

## 2023-01-16 MED ORDER — DICYCLOMINE HCL 10 MG PO CAPS
20.0000 mg | ORAL_CAPSULE | Freq: Once | ORAL | Status: AC
  Administered 2023-01-16: 20 mg via ORAL
  Filled 2023-01-16: qty 2

## 2023-01-16 MED ORDER — IOHEXOL 300 MG/ML  SOLN
100.0000 mL | Freq: Once | INTRAMUSCULAR | Status: AC | PRN
  Administered 2023-01-16: 100 mL via INTRAVENOUS

## 2023-01-16 MED ORDER — DICYCLOMINE HCL 20 MG PO TABS: 20.0000 mg | ORAL_TABLET | Freq: Two times a day (BID) | ORAL | 0 refills | Status: AC

## 2023-01-16 NOTE — ED Provider Notes (Signed)
[place] EMERGENCY DEPARTMENT AT [facility] Provider Note   CSN: [id number] Arrival date & time: 01/16/23  1139     History Chief Complaint  Patient presents with   Abdominal Pain    Alec Howell is a 52 y.o. male with history of diabetes ( not on medication), and HTN presents emerged from today for evaluation of diffuse abdominal pain, mainly in the left lower quadrant pain with diarrhea since Saturday morning.  He reports is intermittently sharp and stabbing sensation.  He reports he has about 3 episodes of nonbloody, nonblack watery diarrhea.  He denies any nausea, vomiting, fevers, chest pain, shortness of breath, dysuria, hematuria.  He reports that he had some pork chops the night before at home.  He reports that he went to his primary care doctor yesterday for a "sugar check" but did not mention this to him.  He reports it feels like "gas pain". No recent travel or antibiotics. No known drug allergies.  Smokes half pack per day.  He reports he has three, 24 oz beers a day. 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    Abdominal Pain Associated symptoms: diarrhea   Associated symptoms: no chest pain, no chills, no constipation, no dysuria, no fever, no hematuria, no nausea, no shortness of breath and no vomiting        Home Medications Prior to Admission medications   Medication Sig Start Date End Date Taking? Authorizing Provider  amLODipine (NORVASC) 10 MG tablet Take 10 mg by mouth daily.    [provider]  amoxicillin-clavulanate (AUGMENTIN) 875-125 MG tablet Take 1 tablet by mouth 2 (two) times daily. 12/16/20   [provider]  fluticasone (FLONASE) 50 MCG/ACT nasal spray Place 1 spray into both nostrils daily. 12/17/20   Jacalyn Lefevre, MD  hydrochlorothiazide (HYDRODIURIL) 25 MG tablet Take 50 mg by mouth daily.    [provider]  ibuprofen (ADVIL,MOTRIN) 200 MG tablet Take 200 mg by mouth every 6 (six) hours as needed for  mild pain or moderate pain.    [provider]  meloxicam (MOBIC) 7.5 MG tablet Take 1 tablet (7.5 mg total) by mouth daily. Patient not taking: No sig reported 06/23/20   Vickki Hearing, MD  predniSONE (STERAPRED UNI-PAK 21 TAB) 10 MG (21) TBPK tablet Take 6 tabs for 2 days, then 5 for 2 days, then 4 for 2 days, then 3 for 2 days, 2 for 2 days, then 1 for 2 days 12/17/20   Jacalyn Lefevre, MD  traMADol-acetaminophen (ULTRACET) 37.5-325 MG tablet Take 1 tablet by mouth every 6 (six) hours as needed.    [provider]      Allergies    Patient has no known allergies.    Review of Systems   Review of Systems  Constitutional:  Negative for appetite change, chills and fever.  Respiratory:  Negative for shortness of breath.   Cardiovascular:  Negative for chest pain.  Gastrointestinal:  Positive for abdominal pain and diarrhea. Negative for blood in stool, constipation, nausea and vomiting.  Genitourinary:  Negative for dysuria and hematuria.  Musculoskeletal:  Negative for joint swelling.    Physical Exam Updated Vital Signs BP (!) 155/100 (BP Location: Right Arm)   Pulse 79   Temp 98.1 F (36.7 C) (Oral)   Resp 18   Ht 5\' 3"  (1.6 m)   Wt 98.9 kg   SpO2 100%   BMI 38.62 kg/m  Physical Exam Constitutional:  General: He is not in acute distress.    Appearance: Normal appearance. He is not ill-appearing or toxic-appearing.  HENT:     Head: Normocephalic and atraumatic.  Eyes:     General: No scleral icterus. Cardiovascular:     Rate and Rhythm: Normal rate and regular rhythm.  Pulmonary:     Effort: Pulmonary effort is normal. No respiratory distress.  Abdominal:     General: Bowel sounds are normal.     Palpations: Abdomen is soft.     Tenderness: There is abdominal tenderness. There is no right CVA tenderness, left CVA tenderness, guarding or rebound.     Comments: Lower abdominal tenderness to palpation. Soft. Non distended. NBS. No guarding or  rebound.   Musculoskeletal:        General: No deformity.     Cervical back: Normal range of motion.  Skin:    General: Skin is warm and dry.  Neurological:     General: No focal deficit present.     Mental Status: He is alert. Mental status is at baseline.     ED Results / Procedures / Treatments   Labs (all labs ordered are listed, but only abnormal results are displayed) Labs Reviewed  COMPREHENSIVE METABOLIC PANEL - Abnormal; Notable for the following components:      Result Value   Sodium 131 (*)    Chloride 97 (*)    Glucose, Bld 177 (*)    AST 45 (*)    ALT 68 (*)    All other components within normal limits  CBC - Abnormal; Notable for the following components:   WBC 11.8 (*)    Platelets 126 (*)    All other components within normal limits  LIPASE, BLOOD  URINALYSIS, ROUTINE W REFLEX MICROSCOPIC    EKG None  Radiology CT ABDOMEN PELVIS W CONTRAST  Result Date: 01/16/2023 CLINICAL DATA:  Two day history of lower abdominal pain EXAM: CT ABDOMEN AND PELVIS WITH CONTRAST TECHNIQUE: Multidetector CT imaging of the abdomen and pelvis was performed using the standard protocol following bolus administration of intravenous contrast. RADIATION DOSE REDUCTION: This exam was performed according to the departmental dose-optimization program which includes automated exposure control, adjustment of the mA and/or kV according to patient size and/or use of iterative reconstruction technique. CONTRAST:  OMNIPAQUE IOHEXOL 300 MG/ML  SOLN COMPARISON:  CT abdomen and pelvis dated 08/10/2008 FINDINGS: Lower chest: No focal consolidation or pulmonary nodule in the lung bases. No pleural effusion or pneumothorax demonstrated. Partially imaged heart size is normal. Coronary artery calcifications. Hepatobiliary: Diffuse parenchymal hypoattenuation can be seen with hepatic steatosis. Sparing along the gallbladder fossa. No intra or extrahepatic biliary ductal dilation. Normal gallbladder.  Pancreas: No focal lesions or main ductal dilation. Spleen: Normal in size without focal abnormality. Adrenals/Urinary Tract: No adrenal nodules. No suspicious renal mass, calculi or hydronephrosis. No focal bladder wall thickening. Stomach/Bowel: Normal appearance of the stomach. Mural thickening and pericolonic stranding of the ascending colon. Colonic diverticulosis without acute diverticulitis. Normal appendix. Vascular/Lymphatic: Aortic atherosclerosis. 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 No enlarged abdominal or pelvic lymph nodes. Prominent periportal lymph measures 1.1 cm (2:33). Reproductive: Prostate is unremarkable. Other: No free fluid, fluid collection, or free air. Musculoskeletal: No acute or abnormal lytic or blastic osseous lesions. Old fracture of the left posterior twelfth rib. IMPRESSION: 1. Mural thickening and pericolonic stranding of the ascending colon, suggestive of colitis. 2. Colonic diverticulosis without acute diverticulitis. 3. Hepatic steatosis. 4.  Aortic Atherosclerosis (ICD10-I70.0). Electronically Signed   By: Agustin Cree M.D.   On: 01/16/2023 13:10    Procedures Procedures   Medications Ordered in ED Medications  iohexol (OMNIPAQUE) 300 MG/ML solution 100 mL (100 mLs Intravenous Contrast Given 01/16/23 1245)    ED Course/ Medical Decision Making/ A&P                           Medical Decision Making Amount and/or Complexity of Data Reviewed Labs: ordered. Radiology: ordered.  Risk Prescription drug management.   52 y.o. male presents to the ER for evaluation of lower abdominal pain and diarrhea for the past five days. Differential diagnosis includes but is not limited to AAA, mesenteric ischemia, appendicitis, diverticulitis, DKA, gastroenteritis, nephrolithiasis, pancreatitis, constipation, UTI, bowel obstruction, biliary disease, IBD, PUD, hepatitis. Vital signs show elevated BP, otherwise unremarkable. Physical exam as noted above.    Will order CT imaging and labs.  Patient is well-appearing, minimal tenderness to the abdomen.  I independently reviewed and interpreted the patient's labs.  Lipase within normal limits.  CMP shows decreased sodium at 131 although likely pseudo hyponatremia given glucose is 177.  Minimally detected decreased chloride at 97.  AST and ALT are minimally elevated at 45 and 68, likely due to the patient's daily alcohol use.  No other electrolyte or LFT abnormality.  CBC shows small amount of leukocytosis at 11.8.  Decrease in platelets at 126.  Recent lab work hard to compare this to his 14 years prior and his platelets were 160.  Thrombocytopenia likely due to alcohol use.  Urinalysis shows colorless but concentrated with 50 glucose.  No ketones..  No signs of appendicitis or diverticulitis seen on CT imaging.  Lipase within normal limits, doubt any pancreatitis.  Symptoms not within presentation of nephrolithiasis.  Urine does not show any evidence of any UTI.  No bowel obstruction seen on CT imaging.  He does not have any dark or bloody stools, doubt any IBD.  CT imaging shows  1. Mural thickening and pericolonic stranding of the ascending colon, suggestive of colitis. 2. Colonic diverticulosis without acute diverticulitis. 3. Hepatic steatosis. 4.  Aortic Atherosclerosis   After consideration of the diagnostic results and the patients response to treatment, I feel that the patient can be discharged home PCP follow up.  Minimally elevated white blood cell count. Elevated liver enzymes likely due to EtOH use.  Patient likely has a viral gastroenteritis causing colitis.  Given he is afebrile with a normal heart rate, I do not think that he needs any antibiotics or any IV fluids at this time.  He does not have any change of appetite and is even eating and drinking well.  Will prescribe him some Bentyl and have him follow-up with his PCP.   We discussed the results of the labs/imaging. The plan is to take  Bentyl, bland diet, follow-up with PCP, follow-up with GI. We discussed strict return precautions and red flag symptoms. The patient verbalized their understanding and agrees to the plan. The patient is stable and being discharged home in good condition.  Portions of this report may have been transcribed using voice recognition software. Every effort was made to ensure accuracy; however, inadvertent computerized transcription errors may be present.   Final Clinical Impression(s) / ED Diagnoses Final diagnoses:  Colitis    Rx / DC Orders ED Discharge Orders          Ordered    dicyclomine (  BENTYL) 20 MG tablet  2 times daily        01/16/23 1328              Achille RichRansom, Kafi Dotter, [place]PA-C 01/16/23 1456    Sloan LeiterGray, Samuel A, DO 01/17/23 [phone]

## 2023-01-16 NOTE — ED Triage Notes (Signed)
Reports since Saturday having sharp LLQ pain with diarrhea.  Reports no nausea.  Reports no blood in stool.

## 2023-01-16 NOTE — Discharge Instructions (Addendum)
You were seen in the ER for evaluation of your abdominal pain with diarrhea.  Your CT imaging shows colitis which is a self-limiting process.  I recommend taking the Bentyl as prescribed to you.  I also recommend a bland diet as well.  If you start to have any black or bloody stools, any fevers, worsening belly pain, nausea, vomiting, please return to the nearest emergency room for evaluation.  Otherwise, please follow-up with your primary care provider.  I am going to insert the information for a GI provider for you to follow-up with for possibly colonoscopy.  Elevated liver enzymes likely due to your alcohol use.  I recommend slowly cutting back on this.  If you have any concerns, new or worsening symptoms, please return to the nearest emergency department for evaluation.  Contact a health care provider if: Your symptoms do not go away. You develop new symptoms. Get help right away if: You have a fever that does not go away with treatment. You develop chills. You have extreme weakness, fainting, or dehydration. You vomit repeatedly. You develop severe pain in your abdomen. You [place] bloody or tarry stool.

## 2023-04-25 DIAGNOSIS — I10 Essential (primary) hypertension: Secondary | ICD-10-CM | POA: Diagnosis not present

## 2023-04-25 DIAGNOSIS — E785 Hyperlipidemia, unspecified: Secondary | ICD-10-CM | POA: Diagnosis not present

## 2023-04-25 DIAGNOSIS — Z0001 Encounter for general adult medical examination with abnormal findings: Secondary | ICD-10-CM | POA: Diagnosis not present

## 2023-04-25 DIAGNOSIS — Z6838 Body mass index (BMI) 38.0-38.9, adult: Secondary | ICD-10-CM | POA: Diagnosis not present

## 2023-04-25 DIAGNOSIS — E1165 Type 2 diabetes mellitus with hyperglycemia: Secondary | ICD-10-CM | POA: Diagnosis not present

## 2023-04-25 DIAGNOSIS — Z0000 Encounter for general adult medical examination without abnormal findings: Secondary | ICD-10-CM | POA: Diagnosis not present

## 2023-04-25 DIAGNOSIS — E6609 Other obesity due to excess calories: Secondary | ICD-10-CM | POA: Diagnosis not present

## 2023-04-25 DIAGNOSIS — Z1331 Encounter for screening for depression: Secondary | ICD-10-CM | POA: Diagnosis not present

## 2023-08-02 DIAGNOSIS — H01002 Unspecified blepharitis right lower eyelid: Secondary | ICD-10-CM | POA: Diagnosis not present

## 2023-08-02 DIAGNOSIS — H01001 Unspecified blepharitis right upper eyelid: Secondary | ICD-10-CM | POA: Diagnosis not present

## 2023-08-02 DIAGNOSIS — H401131 Primary open-angle glaucoma, bilateral, mild stage: Secondary | ICD-10-CM | POA: Diagnosis not present

## 2023-08-02 DIAGNOSIS — H01004 Unspecified blepharitis left upper eyelid: Secondary | ICD-10-CM | POA: Diagnosis not present

## 2023-08-13 DIAGNOSIS — H401111 Primary open-angle glaucoma, right eye, mild stage: Secondary | ICD-10-CM | POA: Diagnosis not present

## 2023-08-27 DIAGNOSIS — Z1211 Encounter for screening for malignant neoplasm of colon: Secondary | ICD-10-CM | POA: Diagnosis not present

## 2023-08-27 DIAGNOSIS — Z1212 Encounter for screening for malignant neoplasm of rectum: Secondary | ICD-10-CM | POA: Diagnosis not present

## 2023-08-27 DIAGNOSIS — H401131 Primary open-angle glaucoma, bilateral, mild stage: Secondary | ICD-10-CM | POA: Diagnosis not present

## 2023-09-24 DIAGNOSIS — H401131 Primary open-angle glaucoma, bilateral, mild stage: Secondary | ICD-10-CM | POA: Diagnosis not present

## 2023-12-24 DIAGNOSIS — H401131 Primary open-angle glaucoma, bilateral, mild stage: Secondary | ICD-10-CM | POA: Diagnosis not present

## 2024-01-29 ENCOUNTER — Other Ambulatory Visit: Payer: Self-pay

## 2024-01-29 ENCOUNTER — Emergency Department (HOSPITAL_COMMUNITY)

## 2024-01-29 ENCOUNTER — Encounter (HOSPITAL_COMMUNITY): Payer: Self-pay | Admitting: *Deleted

## 2024-01-29 ENCOUNTER — Emergency Department (HOSPITAL_COMMUNITY)
Admission: EM | Admit: 2024-01-29 | Discharge: 2024-01-29 | Disposition: A | Discharge status: Home / Self Care | Attending: Emergency Medicine | Admitting: Emergency Medicine

## 2024-01-29 DIAGNOSIS — S42294A Other nondisplaced fracture of upper end of right humerus, initial encounter for closed fracture: Secondary | ICD-10-CM | POA: Diagnosis not present

## 2024-01-29 DIAGNOSIS — W19XXXA Unspecified fall, initial encounter: Secondary | ICD-10-CM | POA: Diagnosis not present

## 2024-01-29 DIAGNOSIS — S42251A Displaced fracture of greater tuberosity of right humerus, initial encounter for closed fracture: Secondary | ICD-10-CM | POA: Insufficient documentation

## 2024-01-29 DIAGNOSIS — M19011 Primary osteoarthritis, right shoulder: Secondary | ICD-10-CM | POA: Diagnosis not present

## 2024-01-29 DIAGNOSIS — S4991XA Unspecified injury of right shoulder and upper arm, initial encounter: Secondary | ICD-10-CM | POA: Diagnosis not present

## 2024-01-29 DIAGNOSIS — S42254A Nondisplaced fracture of greater tuberosity of right humerus, initial encounter for closed fracture: Secondary | ICD-10-CM | POA: Diagnosis not present

## 2024-01-29 MED ORDER — OXYCODONE-ACETAMINOPHEN 5-325 MG PO TABS
2.0000 | ORAL_TABLET | Freq: Once | ORAL | Status: AC
  Administered 2024-01-29: 2 via ORAL
  Filled 2024-01-29: qty 2

## 2024-01-29 MED ORDER — OXYCODONE-ACETAMINOPHEN 5-325 MG PO TABS: 1.0000 | ORAL_TABLET | Freq: Three times a day (TID) | ORAL | 0 refills | Status: DC | PRN

## 2024-01-29 NOTE — Discharge Instructions (Signed)
 You have broken the tuberosity of your humerus where your supraspinatus and infraspinatus attach which are rotator cuff muscles so you really need to follow up with Dr. Phyllis Breeze for recheck and possibly PT to avoid lifelong issues. Use the sling as needed. Id recommend no heavy lifting until you get evaluated by Dr. Phyllis Breeze.

## 2024-01-29 NOTE — ED Triage Notes (Signed)
 Pt states his feet slipped out from under him and he tried to catch himself landing backwards on both hands; pt c/o right shoulder pain due to the fall  Fall occurred yesterday

## 2024-01-30 ENCOUNTER — Ambulatory Visit: Admitting: Orthopedic Surgery

## 2024-01-30 ENCOUNTER — Encounter: Payer: Self-pay | Admitting: Orthopedic Surgery

## 2024-01-30 VITALS — BP 198/133 | HR 91 | Ht 63.0 in | Wt 215.0 lb

## 2024-01-30 DIAGNOSIS — S42254A Nondisplaced fracture of greater tuberosity of right humerus, initial encounter for closed fracture: Secondary | ICD-10-CM

## 2024-01-30 DIAGNOSIS — W19XXXA Unspecified fall, initial encounter: Secondary | ICD-10-CM | POA: Diagnosis not present

## 2024-01-30 NOTE — ED Provider Notes (Signed)
 [place] [facility]ERGENC[facility] DEPARTMENT AT [facility] Provider Note   CSN: [id number] Arrival date & time: 01/29/24  4098     History  Chief Complaint  Patient presents with   Alec Howell is a 53 y.o. male.  Patient lost his balance last night fell backwards on both his outstretched arms heard and felt a snap in his right shoulder with severe pain.  Try to sleep it off it at work so came to the ER for evaluation.  No injuries elsewhere.   Fall       Home Medications Prior to Admission medications   Medication Sig Start Date End Date Taking? Authorizing Provider  oxyCODONE -acetaminophen  (PERCOCET) 5-325 MG tablet Take 1-2 tablets by mouth every 8 (eight) hours as needed for severe pain (pain score 7-10). 01/29/24  Yes Mechell Girgis, Reymundo Caulk, MD  amLODipine (NORVASC) 10 MG tablet Take 10 mg by mouth daily.    [provider]  amoxicillin-clavulanate (AUGMENTIN) 875-125 MG tablet Take 1 tablet by mouth 2 (two) times daily. 12/16/20   [provider]  dicyclomine  (BENTYL ) 20 MG tablet Take 1 tablet (20 mg total) by mouth 2 (two) times daily. 01/16/23   Spence Dux, PA-C  fluticasone  (FLONASE ) 50 MCG/ACT nasal spray Place 1 spray into both nostrils daily. 12/17/20   Haviland, Julie, MD  hydrochlorothiazide (HYDRODIURIL) 25 MG tablet Take 50 mg by mouth daily.    [provider]  ibuprofen  (ADVIL ,MOTRIN ) 200 MG tablet Take 200 mg by mouth every 6 (six) hours as needed for mild pain or moderate pain.    [provider]  meloxicam  (MOBIC ) 7.5 MG tablet Take 1 tablet (7.5 mg total) by mouth daily. Patient not taking: No sig reported 06/23/20   Darrin Emerald, MD  predniSONE  (STERAPRED UNI-PAK 21 TAB) 10 MG (21) TBPK tablet Take 6 tabs for 2 days, then 5 for 2 days, then 4 for 2 days, then 3 for 2 days, 2 for 2 days, then 1 for 2 days 12/17/20   Sueellen Emery, MD  traMADol -acetaminophen  (ULTRACET ) 37.5-325 MG tablet Take 1 tablet by mouth every 6  (six) hours as needed.    [provider]      Allergies    Patient has no known allergies.    Review of Systems   Review of Systems  Physical Exam Updated Vital Signs BP (!) 186/110   Pulse (!) 104   Temp 98.1 F (36.7 C) (Oral)   Resp 19   Ht 5\' 3"  (1.6 m)   Wt 97.5 kg   SpO2 96%   BMI 38.09 kg/m  Physical Exam Vitals and nursing note reviewed.  Constitutional:      Appearance: He is well-developed.  HENT:     Head: Normocephalic and atraumatic.  Cardiovascular:     Rate and Rhythm: Normal rate.  Pulmonary:     Effort: Pulmonary effort is normal. No respiratory distress.  Abdominal:     General: There is no distension.  Musculoskeletal:        General: Tenderness (Right lateral shoulder, decreased range of motion/strength likely related to pain) present. Normal range of motion.     Cervical back: Normal range of motion.  Neurological:     Mental Status: He is alert.     ED Results / Procedures / Treatments   Labs (all labs ordered are listed, but only abnormal results are displayed) Labs Reviewed - No data to display  EKG None  Radiology DG Shoulder  Right Result Date: 01/29/2024 CLINICAL DATA:  Pain after fall. EXAM: RIGHT SHOULDER - 2+ VIEW COMPARISON:  None Available. FINDINGS: Vertically oriented essentially nondisplaced fracture of the lateral humeral head. Fracture involves the region of the rotator cuff insertion. No intra-articular involvement. No glenohumeral dislocation. Acromioclavicular degenerative change. IMPRESSION: Vertically oriented essentially nondisplaced fracture of the lateral humeral head. Electronically Signed   By: Chadwick Colonel M.D.   On: 01/29/2024 10:02    Procedures Procedures    Medications Ordered in ED Medications  oxyCODONE -acetaminophen  (PERCOCET/ROXICET) 5-325 MG per tablet 2 tablet (2 tablets Oral Given 01/29/24 0708)    ED Course/ Medical Decision Making/ A&P                                 Medical  Decision Making Amount and/or Complexity of Data Reviewed Radiology: ordered.  Risk Prescription drug management.   My interpretation of the xr shows humeral tuberosity fracture. Radiology reads taking a long time so patient requested to go home pending read which I felt appropriate. I discussed pain management, using the sling, not lifting anything heavy and fu w/ ortho in a week or so to assess healing and the possible need for PT/OT. He was hesitant however I stressed the importance of evaluation of rotator cuff integrity and ensuring the fracture didn't worsen and/or considertion for surgery given his age and active lifestyle, he agreed. Planned to call if any significant change on xr read.   Reviewed rads read. Essentially the same as mine. No change in treatment/plan that patient is already aware of.   Final Clinical Impression(s) / ED Diagnoses Final diagnoses:  Closed displaced fracture of greater tuberosity of right humerus, initial encounter    Rx / DC Orders ED Discharge Orders          Ordered    oxyCODONE -acetaminophen  (PERCOCET) 5-325 MG tablet  Every 8 hours PRN        01/29/24 0709              Niquita Digioia, MD 01/30/24 0010

## 2024-01-30 NOTE — Patient Instructions (Signed)
 Needs work note out of work 6 weeks

## 2024-01-30 NOTE — Progress Notes (Signed)
 Chief Complaint  Patient presents with   Shoulder Injury    Right     Shoulder Injury  The right shoulder is affected. The incident occurred 2 days ago. The injury mechanism was a fall. The quality of the pain is described as aching. The pain does not radiate. The pain is mild. Pertinent negatives include no chest pain, muscle weakness, numbness or tingling. The symptoms are aggravated by movement. He has tried immobilization and rest (Opioid pain medication) for the symptoms. The treatment provided moderate relief.   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 Exam conducted with a chaperone present.  Constitutional:      General: He is not in acute distress.    Appearance: Normal appearance. He is normal weight. He is not ill-appearing, toxic-appearing or diaphoretic.  HENT:     Head: Normocephalic and atraumatic.  Eyes:     General: No scleral icterus.       Right eye: No discharge.        Left eye: No discharge.     Extraocular Movements: Extraocular movements intact.     Pupils: Pupils are equal, round, and reactive to light.  Cardiovascular:     Rate and Rhythm: Normal rate.     Pulses: Normal pulses.  Pulmonary:     Effort: Pulmonary effort is normal.  Musculoskeletal:     Comments: Right shoulder is in the sling he is moving his hand wrist and fingers normally  He has some swelling and tenderness over the right shoulder  Skin:    General: Skin is warm and dry.     Capillary Refill: Capillary refill takes less than 2 seconds.  Neurological:     General: No focal deficit present.     Mental Status: He is alert and oriented to person, place, and time.     Sensory: No sensory deficit.     Gait: Gait normal.   Psychiatric:        Mood and Affect: Mood normal.    Encounter Diagnosis  Name Primary?   Closed nondisplaced fracture of greater tuberosity of right humerus, initial encounter Yes   [address] fracture care closed treatment greater tuberosity fracture without manipulation  Assessment and plan 53 year old male nondisplaced fracture right greater tuberosity  Recommend sling and weekly x-rays to evaluate for any displacement.  Patient was advised that any displacement would require treatment  Patient expected to be out of work 6 weeks minimum  Patient emphasized that he needs to return to work as soon as possible.  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

## 2024-01-30 NOTE — Progress Notes (Signed)
  Intake history:  Ht 5\' 3"  (1.6 m)   Wt 215 lb (97.5 kg)   BMI 38.09 kg/m  Body mass index is 38.09 kg/m.    WHAT ARE WE SEEING YOU FOR TODAY?   right shoulder  How long has this bothered you? (DOI?DOS?WS?)  01/28/24  Anticoag.  No  Diabetes No  Heart disease No  Hypertension Yes  SMOKING HX Yes  Kidney disease No  Any ALLERGIES ______________________________________________   Treatment:  Have you taken:  Tylenol  No  Advil  No  Had PT No  Had injection No  Other  ______________Oxycodone and sling from ER ___________

## 2024-02-01 DIAGNOSIS — Z0289 Encounter for other administrative examinations: Secondary | ICD-10-CM

## 2024-02-05 ENCOUNTER — Telehealth: Payer: Self-pay

## 2024-02-05 NOTE — Telephone Encounter (Signed)
 Patient is asking for a refill on prescription that he got from ER  Oxycodone -Acetaminophen  5/325 MG  Qty 20 Tablets Take 1-2 tablets by mouth every 8 (eight) hours as needed for severe pain (pain score 7-10).   PATIENT USES [facility]

## 2024-02-07 ENCOUNTER — Ambulatory Visit: Admitting: Orthopedic Surgery

## 2024-02-07 ENCOUNTER — Other Ambulatory Visit (INDEPENDENT_AMBULATORY_CARE_PROVIDER_SITE_OTHER): Payer: Self-pay

## 2024-02-07 ENCOUNTER — Telehealth: Payer: Self-pay | Admitting: Orthopedic Surgery

## 2024-02-07 DIAGNOSIS — S42254D Nondisplaced fracture of greater tuberosity of right humerus, subsequent encounter for fracture with routine healing: Secondary | ICD-10-CM

## 2024-02-07 DIAGNOSIS — S42254A Nondisplaced fracture of greater tuberosity of right humerus, initial encounter for closed fracture: Secondary | ICD-10-CM | POA: Diagnosis not present

## 2024-02-07 MED ORDER — OXYCODONE-ACETAMINOPHEN 5-325 MG PO TABS: 1.0000 | ORAL_TABLET | Freq: Four times a day (QID) | ORAL | 0 refills | Status: AC | PRN

## 2024-02-07 NOTE — Progress Notes (Signed)
 Fracture care follow-up  Encounter Diagnosis  Name Primary?   Closed nondisplaced fracture of greater tuberosity of right humerus with routine healing, subsequent encounter Yes    Chief Complaint  Patient presents with   Follow-up    Recheck on right humerus fracture, DOI 01-28-24.    Greater tuberosity fracture right shoulder  9 days postinjury treatment sling and swath   DG Shoulder Right Result Date: 02/07/2024 X-ray report: Greater tuberosity fracture right shoulder Date of injury April 22 Today's x-ray which is actually a better Gregoria Leas view looks like the fracture is displaced I cannot tell the amount of displacement Further imaging required   DG Shoulder Right Result Date: 01/29/2024 CLINICAL DATA:  Pain after fall. EXAM: RIGHT SHOULDER - 2+ VIEW COMPARISON:  None Available. FINDINGS: Vertically oriented essentially nondisplaced fracture of the lateral humeral head. Fracture involves the region of the rotator cuff insertion. No intra-articular involvement. No glenohumeral dislocation. Acromioclavicular degenerative change. IMPRESSION: Vertically oriented essentially nondisplaced fracture of the lateral humeral head. Electronically Signed   By: Chadwick Colonel M.D.   On: 01/29/2024 10:02     Assessment and plan  Possible displacement of the tuberosity fracture of the right shoulder  Recommend CT scan with 3D imaging to assess  Patient's pain medication was refilled  Meds ordered this encounter  Medications   oxyCODONE -acetaminophen  (PERCOCET) 5-325 MG tablet    Sig: Take 1 tablet by mouth every 6 (six) hours as needed for up to 5 days for severe pain (pain score 7-10).    Dispense:  20 tablet    Refill:  0

## 2024-02-07 NOTE — Addendum Note (Signed)
 Addended byArla Lab on: 02/07/2024 10:57 AM   Modules accepted: Orders

## 2024-02-07 NOTE — Telephone Encounter (Signed)
 FMLA forms faxed to pts employer at [phone] patient.

## 2024-02-07 NOTE — Progress Notes (Signed)
   There were no vitals taken for this visit.  There is no height or weight on file to calculate BMI.  Chief Complaint  Patient presents with   Follow-up    Recheck on right humerus fracture, DOI 01-28-24.    No diagnosis found.  DOI/DOS/ Date: 01-28-24.  Unchanged

## 2024-02-07 NOTE — Patient Instructions (Signed)
 For CT scan please go ahead and call to schedule your appointment with [facility] within at least one (1) week.   Central Scheduling [phone]

## 2024-02-08 ENCOUNTER — Ambulatory Visit (HOSPITAL_COMMUNITY)
Admission: RE | Admit: 2024-02-08 | Discharge: 2024-02-08 | Disposition: A | Discharge status: Home / Self Care | Source: Ambulatory Visit | Attending: Orthopedic Surgery | Admitting: Orthopedic Surgery

## 2024-02-08 DIAGNOSIS — S42254D Nondisplaced fracture of greater tuberosity of right humerus, subsequent encounter for fracture with routine healing: Secondary | ICD-10-CM

## 2024-02-08 DIAGNOSIS — M19011 Primary osteoarthritis, right shoulder: Secondary | ICD-10-CM | POA: Diagnosis not present

## 2024-02-08 DIAGNOSIS — S42291A Other displaced fracture of upper end of right humerus, initial encounter for closed fracture: Secondary | ICD-10-CM | POA: Diagnosis not present

## 2024-02-08 DIAGNOSIS — S4291XA Fracture of right shoulder girdle, part unspecified, initial encounter for closed fracture: Secondary | ICD-10-CM | POA: Diagnosis not present

## 2024-02-08 DIAGNOSIS — S42301A Unspecified fracture of shaft of humerus, right arm, initial encounter for closed fracture: Secondary | ICD-10-CM | POA: Diagnosis not present

## 2024-02-11 ENCOUNTER — Ambulatory Visit (INDEPENDENT_AMBULATORY_CARE_PROVIDER_SITE_OTHER): Admitting: Orthopedic Surgery

## 2024-02-11 DIAGNOSIS — S42254D Nondisplaced fracture of greater tuberosity of right humerus, subsequent encounter for fracture with routine healing: Secondary | ICD-10-CM | POA: Insufficient documentation

## 2024-02-11 NOTE — Progress Notes (Signed)
 Chief Complaint  Patient presents with   Results    Review CT right shoulder. Frustrated about not knowing about work, needs to know about return to work     Encounter Diagnosis  Name Primary?   Closed nondisplaced fracture of greater tuberosity of right humerus with routine healing, subsequent encounter 01/29/24 Yes    Recheck right shoulder with CT scan check displacement right greater tuberosity fracture  Fortunately he is improving in terms of pain  After reviewing the studies with him including showing him the pictures we decided that trying to correct 1 mm of displacement is probably too much surgery  Follow-up in a week x-ray internal and external views with Y view

## 2024-02-11 NOTE — Progress Notes (Signed)
   There were no vitals taken for this visit.  There is no height or weight on file to calculate BMI.  Chief Complaint  Patient presents with   Results    Review CT right shoulder. Frustrated about not knowing about work, needs to know about return to work     Encounter Diagnosis  Name Primary?   Closed nondisplaced fracture of greater tuberosity of right humerus with routine healing, subsequent encounter 01/29/24 Yes    DOI/DOS/ Date: 01/29/24  Improved

## 2024-02-18 ENCOUNTER — Other Ambulatory Visit: Payer: Self-pay | Admitting: Orthopedic Surgery

## 2024-02-18 ENCOUNTER — Encounter: Payer: Self-pay | Admitting: Orthopedic Surgery

## 2024-02-18 ENCOUNTER — Other Ambulatory Visit (INDEPENDENT_AMBULATORY_CARE_PROVIDER_SITE_OTHER): Payer: Self-pay

## 2024-02-18 ENCOUNTER — Ambulatory Visit: Admitting: Orthopedic Surgery

## 2024-02-18 DIAGNOSIS — S42254D Nondisplaced fracture of greater tuberosity of right humerus, subsequent encounter for fracture with routine healing: Secondary | ICD-10-CM

## 2024-02-18 MED ORDER — HYDROCODONE-ACETAMINOPHEN 5-325 MG PO TABS: 1.0000 | ORAL_TABLET | Freq: Three times a day (TID) | ORAL | 0 refills | Status: DC | PRN

## 2024-02-18 MED ORDER — HYDROCODONE-ACETAMINOPHEN 5-325 MG PO TABS: 1.0000 | ORAL_TABLET | Freq: Three times a day (TID) | ORAL | 0 refills | Status: AC | PRN

## 2024-02-18 NOTE — Telephone Encounter (Signed)
 Patient asking for pain meds to [facility]

## 2024-02-18 NOTE — Progress Notes (Signed)
   Chief Complaint  Patient presents with   Shoulder Pain    Right  01/29/24    Encounter Diagnosis  Name Primary?   Closed nondisplaced fracture of greater tuberosity of right humerus with routine healing, subsequent encounter 01/29/24 Yes    DOI/DOS/ Date: 01/29/24  Improved  Lawren continues to improve  DG Shoulder Right Result Date: 02/18/2024 Imaging right shoulder Greater tuberosity fracture Compared to prior imaging including CT Minimally displaced right greater tuberosity fracture which has not or at least does not appear to have changed in position 3 weeks postinjury Impression minimally displaced greater tuberosity fracture right shoulder    He can start modified Codman exercises over the next 3 weeks  Return 3 weeks for x-rays including internal and external rotation views of the right shoulder with the scapular Y view

## 2024-02-18 NOTE — Progress Notes (Signed)
   There were no vitals taken for this visit.  There is no height or weight on file to calculate BMI.  Chief Complaint  Patient presents with   Shoulder Pain    Right  01/29/24    Encounter Diagnosis  Name Primary?   Closed nondisplaced fracture of greater tuberosity of right humerus with routine healing, subsequent encounter 01/29/24 Yes    DOI/DOS/ Date: 01/29/24  Improved

## 2024-03-10 ENCOUNTER — Encounter: Payer: Self-pay | Admitting: Orthopedic Surgery

## 2024-03-10 ENCOUNTER — Other Ambulatory Visit (INDEPENDENT_AMBULATORY_CARE_PROVIDER_SITE_OTHER)

## 2024-03-10 ENCOUNTER — Ambulatory Visit (INDEPENDENT_AMBULATORY_CARE_PROVIDER_SITE_OTHER): Admitting: Orthopedic Surgery

## 2024-03-10 DIAGNOSIS — S42254D Nondisplaced fracture of greater tuberosity of right humerus, subsequent encounter for fracture with routine healing: Secondary | ICD-10-CM

## 2024-03-10 NOTE — Progress Notes (Signed)
   There were no vitals taken for this visit.  There is no height or weight on file to calculate BMI.  Chief Complaint  Patient presents with   Shoulder Pain    Right     Encounter Diagnosis  Name Primary?   Closed nondisplaced fracture of greater tuberosity of right humerus with routine healing, subsequent encounter 01/29/24 Yes    DOI/DOS/ Date: 01/29/24  Improved

## 2024-03-10 NOTE — Progress Notes (Signed)
   Chief Complaint  Patient presents with   Shoulder Pain    Right     Encounter Diagnosis  Name Primary?   Closed nondisplaced fracture of greater tuberosity of right humerus with routine healing, subsequent encounter 01/29/24 Yes    DOI/DOS/ Date: 01/29/24  Improved  53 year old male he is in his 60 weeks after the proximal humerus fracture involving only the greater tuberosity  He has regained all strength and motion  DG Shoulder Right Result Date: 03/10/2024 Imaging report right shoulder follow-up on a greater tuberosity fracture dated injury of January 29, 2024 Serial imaging reviewed Today's images show a comminuted fracture of the greater tuberosity of the right shoulder with minimal displacement The tuberosity fragment does encroach upon the subacromial space Impression probable healing of the greater tuberosity fracture fragment with minimal displacement but some encroachment into the subacromial space     Plan return to work on Wednesday  Return as needed

## 2024-03-27 ENCOUNTER — Telehealth: Payer: Self-pay | Admitting: Orthopedic Surgery

## 2024-03-27 NOTE — Telephone Encounter (Signed)
 Pt paid $20.00 for form per [facility] How[facility]

## 2024-07-03 NOTE — Progress Notes (Signed)
 Alec Howell                                          MRN: [id number]   07/03/2024   The [facility] Quality Team Specialist reviewed this patient medical record for the purposes of chart review for care gap closure. The following were reviewed: chart review for care gap closure-controlling blood pressure.    [facility] Quality Team

## 2024-08-11 ENCOUNTER — Encounter: Payer: Self-pay | Admitting: Radiology
